# Patient Record
Sex: Female | Born: 1955 | Race: White | Marital: Married | Smoking: Current every day smoker
Health system: Southern US, Community
[De-identification: ages and names within clinical notes are randomized; demographics above are authoritative.]

## PROBLEM LIST (undated history)

## (undated) DIAGNOSIS — H43811 Vitreous degeneration, right eye: Secondary | ICD-10-CM

## (undated) DIAGNOSIS — E559 Vitamin D deficiency, unspecified: Secondary | ICD-10-CM

## (undated) DIAGNOSIS — E039 Hypothyroidism, unspecified: Secondary | ICD-10-CM

## (undated) DIAGNOSIS — G500 Trigeminal neuralgia: Secondary | ICD-10-CM

## (undated) DIAGNOSIS — E538 Deficiency of other specified B group vitamins: Secondary | ICD-10-CM

## (undated) HISTORY — DX: Trigeminal neuralgia: G50.0

## (undated) HISTORY — DX: Hypothyroidism, unspecified: E03.9

## (undated) HISTORY — DX: Deficiency of other specified B group vitamins: E53.8

## (undated) HISTORY — DX: Vitamin D deficiency, unspecified: E55.9

## (undated) HISTORY — PX: TONSILLECTOMY AND ADENOIDECTOMY: SUR1326

## (undated) HISTORY — DX: Vitreous degeneration, right eye: H43.811

---

## 2009-08-01 DIAGNOSIS — R5383 Other fatigue: Secondary | ICD-10-CM | POA: Insufficient documentation

## 2009-08-01 DIAGNOSIS — E039 Hypothyroidism, unspecified: Secondary | ICD-10-CM | POA: Insufficient documentation

## 2009-08-01 HISTORY — DX: Hypothyroidism, unspecified: E03.9

## 2009-08-03 DIAGNOSIS — E538 Deficiency of other specified B group vitamins: Secondary | ICD-10-CM | POA: Insufficient documentation

## 2009-08-03 HISTORY — DX: Deficiency of other specified B group vitamins: E53.8

## 2009-09-26 DIAGNOSIS — E559 Vitamin D deficiency, unspecified: Secondary | ICD-10-CM | POA: Insufficient documentation

## 2009-09-26 DIAGNOSIS — G500 Trigeminal neuralgia: Secondary | ICD-10-CM

## 2009-09-26 HISTORY — DX: Trigeminal neuralgia: G50.0

## 2009-09-26 HISTORY — DX: Vitamin D deficiency, unspecified: E55.9

## 2010-02-24 DIAGNOSIS — IMO0002 Reserved for concepts with insufficient information to code with codable children: Secondary | ICD-10-CM | POA: Insufficient documentation

## 2015-07-06 DIAGNOSIS — R531 Weakness: Secondary | ICD-10-CM | POA: Diagnosis not present

## 2015-07-29 DIAGNOSIS — Z5181 Encounter for therapeutic drug level monitoring: Secondary | ICD-10-CM | POA: Diagnosis not present

## 2015-07-29 DIAGNOSIS — R930 Abnormal findings on diagnostic imaging of skull and head, not elsewhere classified: Secondary | ICD-10-CM | POA: Diagnosis not present

## 2015-07-29 DIAGNOSIS — G501 Atypical facial pain: Secondary | ICD-10-CM | POA: Diagnosis not present

## 2015-08-16 DIAGNOSIS — R270 Ataxia, unspecified: Secondary | ICD-10-CM | POA: Diagnosis not present

## 2015-09-09 DIAGNOSIS — R930 Abnormal findings on diagnostic imaging of skull and head, not elsewhere classified: Secondary | ICD-10-CM | POA: Diagnosis not present

## 2015-09-15 DIAGNOSIS — R930 Abnormal findings on diagnostic imaging of skull and head, not elsewhere classified: Secondary | ICD-10-CM | POA: Diagnosis not present

## 2015-09-28 DIAGNOSIS — R9089 Other abnormal findings on diagnostic imaging of central nervous system: Secondary | ICD-10-CM | POA: Diagnosis not present

## 2015-09-28 DIAGNOSIS — G501 Atypical facial pain: Secondary | ICD-10-CM | POA: Diagnosis not present

## 2015-10-03 DIAGNOSIS — Z720 Tobacco use: Secondary | ICD-10-CM | POA: Diagnosis not present

## 2015-10-03 DIAGNOSIS — R42 Dizziness and giddiness: Secondary | ICD-10-CM | POA: Diagnosis not present

## 2015-11-03 DIAGNOSIS — H539 Unspecified visual disturbance: Secondary | ICD-10-CM | POA: Diagnosis not present

## 2015-11-03 DIAGNOSIS — H0011 Chalazion right upper eyelid: Secondary | ICD-10-CM | POA: Diagnosis not present

## 2015-11-03 DIAGNOSIS — Z720 Tobacco use: Secondary | ICD-10-CM | POA: Diagnosis not present

## 2015-11-04 DIAGNOSIS — H01022 Squamous blepharitis right lower eyelid: Secondary | ICD-10-CM | POA: Diagnosis not present

## 2015-11-04 DIAGNOSIS — H43811 Vitreous degeneration, right eye: Secondary | ICD-10-CM | POA: Diagnosis not present

## 2015-11-04 DIAGNOSIS — H01024 Squamous blepharitis left upper eyelid: Secondary | ICD-10-CM | POA: Diagnosis not present

## 2015-11-04 DIAGNOSIS — H01021 Squamous blepharitis right upper eyelid: Secondary | ICD-10-CM | POA: Diagnosis not present

## 2015-11-30 DIAGNOSIS — G501 Atypical facial pain: Secondary | ICD-10-CM | POA: Diagnosis not present

## 2015-11-30 DIAGNOSIS — H43811 Vitreous degeneration, right eye: Secondary | ICD-10-CM | POA: Diagnosis not present

## 2015-12-09 DIAGNOSIS — H43813 Vitreous degeneration, bilateral: Secondary | ICD-10-CM | POA: Diagnosis not present

## 2015-12-09 DIAGNOSIS — H01003 Unspecified blepharitis right eye, unspecified eyelid: Secondary | ICD-10-CM | POA: Diagnosis not present

## 2015-12-09 DIAGNOSIS — H01006 Unspecified blepharitis left eye, unspecified eyelid: Secondary | ICD-10-CM | POA: Diagnosis not present

## 2016-01-27 DIAGNOSIS — H43813 Vitreous degeneration, bilateral: Secondary | ICD-10-CM | POA: Diagnosis not present

## 2016-01-27 DIAGNOSIS — H3561 Retinal hemorrhage, right eye: Secondary | ICD-10-CM | POA: Diagnosis not present

## 2016-01-27 DIAGNOSIS — H02401 Unspecified ptosis of right eyelid: Secondary | ICD-10-CM | POA: Diagnosis not present

## 2016-02-14 DIAGNOSIS — H3561 Retinal hemorrhage, right eye: Secondary | ICD-10-CM | POA: Diagnosis not present

## 2016-02-14 DIAGNOSIS — H43813 Vitreous degeneration, bilateral: Secondary | ICD-10-CM | POA: Diagnosis not present

## 2016-02-14 DIAGNOSIS — H02401 Unspecified ptosis of right eyelid: Secondary | ICD-10-CM | POA: Diagnosis not present

## 2016-03-05 DIAGNOSIS — G501 Atypical facial pain: Secondary | ICD-10-CM | POA: Diagnosis not present

## 2016-03-05 DIAGNOSIS — H02401 Unspecified ptosis of right eyelid: Secondary | ICD-10-CM | POA: Diagnosis not present

## 2016-03-05 LAB — TSH: TSH: 6.05 — AB (ref 0.41–5.90)

## 2016-05-02 DIAGNOSIS — E0500 Thyrotoxicosis with diffuse goiter without thyrotoxic crisis or storm: Secondary | ICD-10-CM | POA: Diagnosis not present

## 2016-05-21 DIAGNOSIS — E039 Hypothyroidism, unspecified: Secondary | ICD-10-CM | POA: Diagnosis not present

## 2016-05-21 DIAGNOSIS — Z720 Tobacco use: Secondary | ICD-10-CM | POA: Diagnosis not present

## 2016-05-21 DIAGNOSIS — H532 Diplopia: Secondary | ICD-10-CM | POA: Diagnosis not present

## 2016-05-21 DIAGNOSIS — E063 Autoimmune thyroiditis: Secondary | ICD-10-CM | POA: Diagnosis not present

## 2016-05-30 DIAGNOSIS — E039 Hypothyroidism, unspecified: Secondary | ICD-10-CM | POA: Diagnosis not present

## 2016-05-30 DIAGNOSIS — E069 Thyroiditis, unspecified: Secondary | ICD-10-CM | POA: Diagnosis not present

## 2016-05-30 DIAGNOSIS — E041 Nontoxic single thyroid nodule: Secondary | ICD-10-CM | POA: Diagnosis not present

## 2016-07-02 DIAGNOSIS — E039 Hypothyroidism, unspecified: Secondary | ICD-10-CM | POA: Diagnosis not present

## 2016-07-02 LAB — CBC AND DIFFERENTIAL: HEMOGLOBIN: 14.4 g/dL (ref 12.0–16.0)

## 2016-07-02 LAB — TSH: TSH: 9.61 u[IU]/mL — AB (ref 0.41–5.90)

## 2016-07-04 DIAGNOSIS — E039 Hypothyroidism, unspecified: Secondary | ICD-10-CM | POA: Diagnosis not present

## 2016-08-07 ENCOUNTER — Encounter: Payer: Self-pay | Admitting: Family Medicine

## 2016-08-07 ENCOUNTER — Ambulatory Visit (INDEPENDENT_AMBULATORY_CARE_PROVIDER_SITE_OTHER): Payer: BLUE CROSS/BLUE SHIELD | Admitting: Family Medicine

## 2016-08-07 VITALS — BP 117/64 | HR 58 | Ht 64.0 in | Wt 123.0 lb

## 2016-08-07 DIAGNOSIS — E538 Deficiency of other specified B group vitamins: Secondary | ICD-10-CM

## 2016-08-07 DIAGNOSIS — E038 Other specified hypothyroidism: Secondary | ICD-10-CM | POA: Diagnosis not present

## 2016-08-07 DIAGNOSIS — E063 Autoimmune thyroiditis: Secondary | ICD-10-CM

## 2016-08-07 DIAGNOSIS — H43811 Vitreous degeneration, right eye: Secondary | ICD-10-CM | POA: Diagnosis not present

## 2016-08-07 DIAGNOSIS — E559 Vitamin D deficiency, unspecified: Secondary | ICD-10-CM

## 2016-08-07 DIAGNOSIS — R5383 Other fatigue: Secondary | ICD-10-CM | POA: Diagnosis not present

## 2016-08-07 DIAGNOSIS — G500 Trigeminal neuralgia: Secondary | ICD-10-CM

## 2016-08-07 DIAGNOSIS — R2689 Other abnormalities of gait and mobility: Secondary | ICD-10-CM | POA: Insufficient documentation

## 2016-08-07 HISTORY — DX: Vitreous degeneration, right eye: H43.811

## 2016-08-07 NOTE — Progress Notes (Signed)
Briana Henderson is a 61 y.o. female who presents to [facility] today for establish care and discuss various medical problems.  Patient has a history of Hashimoto's thyroiditis. Her endocrinologist has been adjusting medicines recently and she is due for recheck TSH and free T4 in the near future. She is not satisfied with her endocrinologist and wishes to switch care if possible. She feels reasonably well and denies feeling to hold her to hot skin or hair changes.  She does have a serious issue with decreased vision in her right eye. A few months ago she suffered a vitreous detachment. She's been managed by multiple different ophthalmologist. She would like to return to her ophthalmologist Briana John, MD.  she notes that her endocrinologist refused to refer to a non[facility] affiliated ophthalmologist. She would like to go back to see [facility]. She notes decreased vision and double vision. She finds is very obnoxious.  Additionally patient has a history of left-sided trigeminal neuralgia. This is been ongoing for years. She has tried multiple different medications which she has not tolerated well. She's currently using lidocaine topically as well as topical capsaicin. She notes this works reasonably well.  Lastly she notes difficulty with balance. She notes that she is trouble with balance and is currently being worked up by a neurologist. She has not had a trial of physical therapy. Her goals are to be a little walk a few miles with her husband without falling.   Past Medical History:  Diagnosis Date  . B12 deficiency 08/03/2009   Overview:  Vitamin B12 Deficiency  . Hypothyroidism 08/01/2009   Overview:  Hypothyroidism  10/1 IMO update  . Trigeminal neuralgia 09/26/2009   Overview:  Trigeminal Neuralgia  . Vitamin D deficiency 09/26/2009   Overview:  Vitamin D Deficiency   10/1 IMO update  . Vitreous detachment of right eye 08/07/2016   Past Surgical History:  Procedure Laterality Date  . TONSILLECTOMY AND ADENOIDECTOMY     Social History  Substance Use Topics  . Smoking status: Current Every Day Smoker  . Smokeless tobacco: Never Used  . Alcohol use No   family history includes Cancer in her paternal grandfather; Diabetes in her brother, father, mother, and sister; Heart disease in her father, maternal grandfather, maternal grandmother, and mother; Hyperlipidemia in her mother; Hypertension in her father and mother.  ROS as above: No new headache, visual changes, nausea, vomiting, diarrhea, constipation, dizziness, abdominal pain, skin rash, fevers, chills, night sweats, weight loss, swollen lymph nodes, body aches, joint swelling, muscle aches, chest pain, shortness of breath, mood changes, visual or auditory hallucinations.    Medications: Current Outpatient Prescriptions  Medication Sig Dispense Refill  . levothyroxine (SYNTHROID) 75 MCG tablet Take one tablet daily and 1-1/2 tablets on Saturday AND Sunday.    . phenytoin (DILANTIN) 100 MG ER capsule TAKE 2 CAPSULES DAILY    . traMADol (ULTRAM) 50 MG tablet Take by mouth.    . fluticasone (FLONASE) 50 MCG/ACT nasal spray fluticasone 50 mcg/actuation nasal spray,suspension    . levothyroxine (SYNTHROID, LEVOTHROID) 75 MCG tablet levothyroxine 75 mcg tablet     No current facility-administered medications for this visit.    Allergies  Allergen Reactions  . Penicillins Other (See Comments) and Hives  . Topiramate     seizures  . Duloxetine Hcl Diarrhea and Nausea Only  . Gabapentin Diarrhea and Nausea Only  . Levetiracetam Diarrhea and Nausea Only  .  Moxifloxacin Hcl In Nacl     hypothermia  . Pregabalin Diarrhea and Nausea Only  . Thimerosal     Health Maintenance Health Maintenance  Topic Date Due  . Hepatitis C Screening  [birth year]  . HIV Screening  02/05/1970  . TETANUS/TDAP   02/05/1974  . PAP SMEAR  02/06/1976  . MAMMOGRAM  02/05/2005  . COLONOSCOPY  02/05/2005  . INFLUENZA VACCINE  10/17/2016     Exam:  BP 117/64   Pulse (!) 58   Ht 5\' 4"  (1.626 m)   Wt 123 lb (55.8 kg)   BMI 21.11 kg/m   Gen: Well NAD HEENT: EOMI,  MMM No nodule or goiter palpable Lungs: Normal work of breathing. CTABL Heart: RRR no MRG Abd: NABS, Soft. Nondistended, Nontender Exts: Brisk capillary refill, warm and well perfused.  Neuro alert and oriented normal coordination.   Ultrasound thyroid3/14/2018 [facility] Result Impression  IMPRESSION: Heterogeneous hypervascular gland consistent with thyroiditis.  8 mm solid nodule in the lower pole of the left lobe.  Result Narrative  TECHNIQUE: Realtime, multiplanar grayscale and color Doppler ultrasound of the thyroid was performed  COMPARISON: None  INDICATION: hypothyroidism 61 year old female  FINDINGS: . Right lobe (cm): 4.2 x 1.4 x 1.4 . Left lobe (cm): 3.1 x 1.2 x 1.1 . Isthmus (mm): 3 Slightly hypoechoic heterogeneous echotexture is identified bilaterally with increased vascularity in both lobes.  . Right thyroid nodules: None . Left thyroid nodules: Slightly hyperechoic 8 x 5 x 6 mm ovoid solid nodule in the lower pole of the left lobe. . Abnormal lymph nodes: None   CT Angio Cerebral W 3D Reconstruction6/29/2017 [facility] Result Narrative  [id number] Attending EA:[id number] KRAFT [phone] Ordering GN:FAOZHY KRAFT Date of Birth:1957/02/14Sex: F Admit Date:09/15/2015 10:00  ###FINAL RESULT###    INDICATIONS: ABNORMAL FINDING ON DIAGNOSTIC IMAGING OF SKULL AND HEAD,  ABNORMAL RIGHT TRANSVE COMMENTS:   PROCEDURE:QCT 0017- CT ANGIO BRAIN/HEAD - Sep 15 2015  Syngo Accession #: [id number] DaVinci Accession #: [id number]     INDICATION: ABNORMAL FINDING ON DIAGNOSTIC IMAGING OF SKULL AND HEAD,  ABNORMAL RIGHT TRANSVERSE SINUS. I-STAT perform, creatinine equals 0.9  mg/dL,  estimated GFR equals 64.  STUDY: CTA COW performed on 09/15/2015 10:30 AM.  COMPARISON: MRI of the head performed on 05/10/2011.  TECHNIQUE: Scout and centrally focus precontrast axial images were first  obtained, followed by a small test bolus of contrast for CTA timing  purposes. Thereafter, a full dose of iodinated contrast was administered  intravenously by rapid injection, with further multislice axial sections  acquired in the arterial phase from the skull base to the cranial vertex.  Image postprocessing was then performed on the independent workstation,  creating multiplanar and 3-D reformatted images for comprehensive  analysis and diagnosis of the extra and intracranial circulation  including the circle of Willis.  Contrast:100 mL [id number]  FINDINGS: #Internal carotid arteries: No aneurysm, significant stenosis or  occlusion. #Anterior circulation: No aneurysm, significant stenosis or occlusion. #Posterior circulation: No aneurysm, significant stenosis or occlusion. #Anatomic variants: There is a posterior communicating infundibulum on  the RIGHT. A fetal type configuration to the PCA is noted on the LEFT. #Venous sinuses: The RIGHT transverse sinus is hypoplastic with a  narrowing at the transverse-sigmoid junction.  #Additional comments: None.    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  1)Degree of stenosis is determined using NASCET measurement technique: Severe: 70-99% Moderate: 50-69% Mild: Less than 50%    Care Everywhere Result  Report TSH4/16/2018 [facility] Component Name Value Ref Range  TSH 9.610 (H) 0.450 - 4.500 uIU/mL   Specimen  Blood  Result Narrative  Performed at:01 - [facility] [address], [place], [id number] Lab Director: Mila HomerWilliam F Hancock MD, Phone:[phone]     No results found for this or any previous visit (from the past 72  hour(s)). No results found.    Assessment and Plan: 61 y.o. female with   Hypothyroid: Likely Hashimoto's. Plan to recheck TSH and T4. We'll repeat thyroid ultrasound scan in 3-6 months following scan in March listed above.  Return to clinic in 1-2 months. Algorithm would indicate fine needle aspiration if the nodule exceeds 1 cm.  Right eye vitreous detachment: Refer back to ophthalmology. Continue to follow.  Trigeminal neuralgia: Continue to manage with neurology.  Impaired balance: Refer to home health physical therapy. Patient will benefit from balance training and potential assistive device such as a rolling walker.  Recheck in 1-2 months. We'll slowly work through the back list of health maintenance items as tolerated by the patient.     Orders Placed This Encounter  Procedures  . TSH  . T4, free  . Vitamin B12  . VITAMIN D 25 Hydroxy (Vit-D Deficiency, Fractures)  . COMPLETE METABOLIC PANEL WITH GFR  . Lipid Panel w/reflex Direct LDL  . Dilantin (Phenytoin) level, total  . Ambulatory referral to Ophthalmology    Referral Priority:   Routine    Referral Type:   Consultation    Referral Reason:   Specialty Services Required    Requested Specialty:   Ophthalmology    Number of Visits Requested:   1  . Ambulatory referral to Home Health    Referral Priority:   Routine    Referral Type:   Home Health Care    Referral Reason:   Specialty Services Required    Requested Specialty:   Home Health Services    Number of Visits Requested:   1   Meds ordered this encounter  Medications  . DISCONTD: phenytoin (DILANTIN) 50 MG tablet    Sig: phenytoin 50 mg chewable tablet  . levothyroxine (SYNTHROID, LEVOTHROID) 75 MCG tablet    Sig: levothyroxine 75 mcg tablet  . traMADol (ULTRAM) 50 MG tablet    Sig: Take by mouth.  . levothyroxine (SYNTHROID) 75 MCG tablet    Sig: Take one tablet daily and 1-1/2 tablets on Saturday AND Sunday.  . phenytoin (DILANTIN) 100 MG ER  capsule    Sig: TAKE 2 CAPSULES DAILY  . fluticasone (FLONASE) 50 MCG/ACT nasal spray    Sig: fluticasone 50 mcg/actuation nasal spray,suspension     Discussed warning signs or symptoms. Please see discharge instructions. Patient expresses understanding.

## 2016-08-07 NOTE — Patient Instructions (Addendum)
Thank you for coming in today. Get labs in the near future fasting.  Lets recheck in 2 months or so.  We may change things based on a results.  I do recommend you continue to see your neurologist.  If you are unhappy with the Endocrinologist we can find someone else if needed.   You should hear from the Eye doctor soon.  Let me know if you do not hear anything.    You should also hear from the home health people about balance pt.

## 2016-08-10 ENCOUNTER — Encounter: Payer: Self-pay | Admitting: Family Medicine

## 2016-08-10 LAB — THYROID STIMULATING IMMUNOGLOBULIN: TSI: <0.1

## 2016-08-10 LAB — LYME, TOTAL AB TEST/REFLEX

## 2016-08-10 LAB — ACETYLCHOLINE RECEPTOR, BINDING: Acetylcholine Rec Binding: <0.03

## 2016-08-10 LAB — PHENYTOIN LEVEL, FREE AND TOTAL: Phenytoin: 3.7

## 2016-08-16 DIAGNOSIS — E063 Autoimmune thyroiditis: Secondary | ICD-10-CM | POA: Diagnosis not present

## 2016-08-16 DIAGNOSIS — E038 Other specified hypothyroidism: Secondary | ICD-10-CM | POA: Diagnosis not present

## 2016-08-16 DIAGNOSIS — E559 Vitamin D deficiency, unspecified: Secondary | ICD-10-CM | POA: Diagnosis not present

## 2016-08-16 DIAGNOSIS — E538 Deficiency of other specified B group vitamins: Secondary | ICD-10-CM | POA: Diagnosis not present

## 2016-08-16 LAB — TSH: TSH: 8.28 mIU/L — ABNORMAL HIGH

## 2016-08-16 LAB — COMPLETE METABOLIC PANEL WITH GFR
ALT: 9 U/L (ref 6–29)
AST: 12 U/L (ref 10–35)
Albumin: 4.1 g/dL (ref 3.6–5.1)
Alkaline Phosphatase: 82 U/L (ref 33–130)
BUN: 7 mg/dL (ref 7–25)
CO2: 23 mmol/L (ref 20–31)
CREATININE: 0.84 mg/dL (ref 0.50–0.99)
Calcium: 9.1 mg/dL (ref 8.6–10.4)
Chloride: 105 mmol/L (ref 98–110)
GFR, EST NON AFRICAN AMERICAN: 75 mL/min (ref >=60)
GFR, Est African American: 87 mL/min (ref >=60)
GLUCOSE: 84 mg/dL (ref 65–99)
Potassium: 4.2 mmol/L (ref 3.5–5.3)
SODIUM: 140 mmol/L (ref 135–146)
TOTAL PROTEIN: 6.1 g/dL (ref 6.1–8.1)
Total Bilirubin: 0.3 mg/dL (ref 0.2–1.2)

## 2016-08-16 LAB — LIPID PANEL W/REFLEX DIRECT LDL
Cholesterol: 192 mg/dL (ref <200)
HDL: 50 mg/dL — AB (ref >50)
LDL-CHOLESTEROL: 117 mg/dL — AB
Non-HDL Cholesterol (Calc): 142 mg/dL — ABNORMAL HIGH (ref <130)
Total CHOL/HDL Ratio: 3.8 Ratio (ref <5.0)
Triglycerides: 133 mg/dL (ref <150)

## 2016-08-16 LAB — VITAMIN D 25 HYDROXY (VIT D DEFICIENCY, FRACTURES): VIT D 25 HYDROXY: 22 ng/mL — AB (ref 30–100)

## 2016-08-16 LAB — T4, FREE: FREE T4: 1 ng/dL (ref 0.8–1.8)

## 2016-08-17 LAB — PHENYTOIN LEVEL, TOTAL: PHENYTOIN LVL: 3 mg/L — AB (ref 10.0–20.0)

## 2016-08-17 LAB — VITAMIN B12: VITAMIN B 12: 1679 pg/mL — AB (ref 200–1100)

## 2016-08-20 MED ORDER — LEVOTHYROXINE SODIUM 88 MCG PO TABS: 88.0000 ug | ORAL_TABLET | Freq: Every day | ORAL | 1 refills | Status: DC

## 2016-08-20 NOTE — Addendum Note (Signed)
Addended by: Rodolph BongOREY, Finnlee Guarnieri S on: 08/20/2016 07:47 AM   Modules accepted: Orders

## 2016-08-23 DIAGNOSIS — G501 Atypical facial pain: Secondary | ICD-10-CM | POA: Diagnosis not present

## 2016-08-23 DIAGNOSIS — E0500 Thyrotoxicosis with diffuse goiter without thyrotoxic crisis or storm: Secondary | ICD-10-CM | POA: Diagnosis not present

## 2016-08-23 DIAGNOSIS — R531 Weakness: Secondary | ICD-10-CM | POA: Diagnosis not present

## 2016-09-07 DIAGNOSIS — H35033 Hypertensive retinopathy, bilateral: Secondary | ICD-10-CM | POA: Diagnosis not present

## 2016-09-07 DIAGNOSIS — H43813 Vitreous degeneration, bilateral: Secondary | ICD-10-CM | POA: Diagnosis not present

## 2016-09-07 DIAGNOSIS — H3561 Retinal hemorrhage, right eye: Secondary | ICD-10-CM | POA: Diagnosis not present

## 2016-09-07 DIAGNOSIS — H02401 Unspecified ptosis of right eyelid: Secondary | ICD-10-CM | POA: Diagnosis not present

## 2016-09-12 ENCOUNTER — Encounter: Payer: Self-pay | Admitting: Family Medicine

## 2016-09-12 LAB — SEDIMENTATION RATE: SED RATE: 2

## 2016-09-12 LAB — ACETYLCHOLINE RECEPTOR, MODULATING: AChR Binding Ab, Serum: <12

## 2016-10-04 ENCOUNTER — Ambulatory Visit (INDEPENDENT_AMBULATORY_CARE_PROVIDER_SITE_OTHER): Payer: BLUE CROSS/BLUE SHIELD | Admitting: Family Medicine

## 2016-10-04 ENCOUNTER — Encounter: Payer: Self-pay | Admitting: Family Medicine

## 2016-10-04 VITALS — BP 117/69 | HR 73 | Ht 64.0 in | Wt 121.0 lb

## 2016-10-04 DIAGNOSIS — E538 Deficiency of other specified B group vitamins: Secondary | ICD-10-CM

## 2016-10-04 DIAGNOSIS — E038 Other specified hypothyroidism: Secondary | ICD-10-CM

## 2016-10-04 DIAGNOSIS — E063 Autoimmune thyroiditis: Secondary | ICD-10-CM

## 2016-10-04 DIAGNOSIS — M25572 Pain in left ankle and joints of left foot: Secondary | ICD-10-CM

## 2016-10-04 DIAGNOSIS — E559 Vitamin D deficiency, unspecified: Secondary | ICD-10-CM

## 2016-10-04 DIAGNOSIS — G8929 Other chronic pain: Secondary | ICD-10-CM | POA: Insufficient documentation

## 2016-10-04 DIAGNOSIS — R5383 Other fatigue: Secondary | ICD-10-CM | POA: Diagnosis not present

## 2016-10-04 LAB — T3, FREE: T3, Free: 2.6 pg/mL (ref 2.3–4.2)

## 2016-10-04 LAB — T4, FREE: Free T4: 1.2 ng/dL (ref 0.8–1.8)

## 2016-10-04 LAB — TSH: TSH: 7.14 m[IU]/L — AB

## 2016-10-04 MED ORDER — TRIAMCINOLONE 0.1 % CREAM:EUCERIN CREAM 1:1: 1.0000 "application " | TOPICAL_CREAM | Freq: Two times a day (BID) | CUTANEOUS | 12 refills | Status: DC | PRN

## 2016-10-04 NOTE — Progress Notes (Signed)
Briana Henderson is a 61 y.o. female who [name] today for follow-up thyroid disease and discuss left ankle pain and dry skin.  Thyroid disease: Patient has a history of hypothyroidism and currently takes 88 g per day of levothyroxine. This was increased from 75 recently. Overall she's feeling a lot better. She does note new dry skin on her chest however. She will need a new prescription soon.  Dry skin: Patient has a patch of dry skin on her neck. This is been present for about a month. She has not tried any treatment yet and she feels well otherwise no fevers or chills vomiting or diarrhea.  Left ankle pain: Patient has a history of mild left ankle pain. She notes this is quite well controlled with compression socks and good quality sneakers. She denies any locking catching or giving way.   Past Medical History:  Diagnosis Date  . B12 deficiency 08/03/2009   Overview:  Vitamin B12 Deficiency  . Hypothyroidism 08/01/2009   Overview:  Hypothyroidism  10/1 IMO update  . Trigeminal neuralgia 09/26/2009   Overview:  Trigeminal Neuralgia  . Vitamin D deficiency 09/26/2009   Overview:  Vitamin D Deficiency  10/1 IMO update  . Vitreous detachment of right eye 08/07/2016   Past Surgical History:  Procedure Laterality Date  . TONSILLECTOMY AND ADENOIDECTOMY     Social History  Substance Use Topics  . Smoking status: Current Every Day Smoker  . Smokeless tobacco: Never Used  . Alcohol use No   family history includes Cancer in her paternal grandfather; Diabetes in her brother, father, mother, and sister; Heart disease in her father, maternal grandfather, maternal grandmother, and mother; Hyperlipidemia in her mother; Hypertension in her father and mother.  ROS as above:  Medications: Current Outpatient Prescriptions  Medication Sig Dispense Refill  .  fluticasone (FLONASE) 50 MCG/ACT nasal spray fluticasone 50 mcg/actuation nasal spray,suspension    . levothyroxine (SYNTHROID) 88 MCG tablet Take 1 tablet (88 mcg total) by mouth daily before breakfast. 30 tablet 1  . phenytoin (DILANTIN) 100 MG ER capsule TAKE 2 CAPSULES DAILY    . traMADol (ULTRAM) 50 MG tablet Take by mouth.    . Triamcinolone Acetonide (TRIAMCINOLONE 0.1 % CREAM : EUCERIN) CREA Apply 1 application topically 2 (two) times daily as needed. Disp 1 pound 1 each 12   No current facility-administered medications for this visit.    Allergies  Allergen Reactions  . Penicillins Other (See Comments) and Hives  . Topiramate     seizures  . Duloxetine Hcl Diarrhea and Nausea Only  . Gabapentin Diarrhea and Nausea Only  . Levetiracetam Diarrhea and Nausea Only  . Moxifloxacin Hcl In Nacl     hypothermia  . Pregabalin Diarrhea and Nausea Only  . Thimerosal     Health Maintenance Health Maintenance  Topic Date Due  . Hepatitis C Screening  11-11-1955  . HIV Screening  02/05/1970  . TETANUS/TDAP  02/05/1974  . PAP SMEAR  02/06/1976  . MAMMOGRAM  02/05/2005  . COLONOSCOPY  02/05/2005  . INFLUENZA VACCINE  10/17/2016     Exam:  BP 117/69   Pulse 73   Ht 5\' 4"  (1.626 m)   Wt 121 lb (54.9 kg)   BMI 20.77 kg/m  Gen: Well NAD HEENT: EOMI,  MMM No goiter Lungs: Normal work of breathing. CTABL Heart: RRR no MRG Abd: NABS, Soft. Nondistended, Nontender Exts: Brisk capillary refill,  warm and well perfused.  Skin: No erythema. Slightly dry skin on neck. MSK: Left ankle normal-appearing no swelling. Mildly tender to palpation at the ATFL area. Stable ligamentous exam normal strength.   No results found for this or any previous visit (from the past 72 hour(s)). No results found.    Assessment and Plan: 61 y.o. female with  Hypothyroid: Plan to recheck thyroid labs today and adjust levothyroxine dose accordingly.  Dry skin: Likely simple dry skin. Thyroid may be a  factor. Plan for Eucerin with triamcinolone cream.  Ankle pain: Likely mild degenerative. Patient declines x-rays at this time. Plan for compressive ankle sleeve.  Vitamin D deficiency and B-12 deficiency. We'll recheck these labs.   Orders Placed This Encounter  Procedures  . T4, free  . T3, free  . TSH  . VITAMIN D 25 Hydroxy (Vit-D Deficiency, Fractures)  . Vitamin B12   Meds ordered this encounter  Medications  . Triamcinolone Acetonide (TRIAMCINOLONE 0.1 % CREAM : EUCERIN) CREA    Sig: Apply 1 application topically 2 (two) times daily as needed. Disp 1 pound    Dispense:  1 each    Refill:  12     Discussed warning signs or symptoms. Please see discharge instructions. Patient expresses understanding.

## 2016-10-04 NOTE — Patient Instructions (Addendum)
Thank you for coming in today. Get labs today.  We will adjust thryoid dose based on labs.  Apply the cream to the dry skin.  We will see how it is doing.

## 2016-10-05 LAB — VITAMIN B12: Vitamin B-12: 968 pg/mL (ref 200–1100)

## 2016-10-05 LAB — VITAMIN D 25 HYDROXY (VIT D DEFICIENCY, FRACTURES): VIT D 25 HYDROXY: 25 ng/mL — AB (ref 30–100)

## 2016-10-05 MED ORDER — LEVOTHYROXINE SODIUM 100 MCG PO TABS: 100.0000 ug | ORAL_TABLET | Freq: Every day | ORAL | 0 refills | Status: DC

## 2016-10-05 NOTE — Addendum Note (Signed)
Addended by: Rodolph BongOREY, Marcell Pfeifer S on: 10/05/2016 05:42 AM   Modules accepted: Orders

## 2016-10-10 ENCOUNTER — Other Ambulatory Visit: Payer: Self-pay | Admitting: *Deleted

## 2016-10-10 MED ORDER — TRIAMCINOLONE 0.1 % CREAM:EUCERIN CREAM 1:1: 1.0000 "application " | TOPICAL_CREAM | Freq: Two times a day (BID) | CUTANEOUS | 12 refills | Status: DC | PRN

## 2016-10-10 NOTE — Progress Notes (Signed)
Pt called and lvm stating that the cream will need to be sent to her local pharmacy. Will reprint and fax.Loralee PacasBarkley, Adewale Pucillo BlomkestLynetta

## 2016-11-23 ENCOUNTER — Ambulatory Visit (INDEPENDENT_AMBULATORY_CARE_PROVIDER_SITE_OTHER): Payer: BLUE CROSS/BLUE SHIELD | Admitting: Family Medicine

## 2016-11-23 ENCOUNTER — Encounter: Payer: Self-pay | Admitting: Family Medicine

## 2016-11-23 VITALS — BP 113/60 | HR 72 | Wt 122.0 lb

## 2016-11-23 DIAGNOSIS — E038 Other specified hypothyroidism: Secondary | ICD-10-CM

## 2016-11-23 DIAGNOSIS — E063 Autoimmune thyroiditis: Secondary | ICD-10-CM

## 2016-11-23 DIAGNOSIS — E559 Vitamin D deficiency, unspecified: Secondary | ICD-10-CM | POA: Diagnosis not present

## 2016-11-23 DIAGNOSIS — R002 Palpitations: Secondary | ICD-10-CM

## 2016-11-23 DIAGNOSIS — E041 Nontoxic single thyroid nodule: Secondary | ICD-10-CM | POA: Diagnosis not present

## 2016-11-23 DIAGNOSIS — G500 Trigeminal neuralgia: Secondary | ICD-10-CM

## 2016-11-23 DIAGNOSIS — E538 Deficiency of other specified B group vitamins: Secondary | ICD-10-CM

## 2016-11-23 MED ORDER — PHENYTOIN SODIUM EXTENDED 100 MG PO CAPS: 300.0000 mg | ORAL_CAPSULE | Freq: Every day | ORAL | 1 refills | Status: DC

## 2016-11-23 MED ORDER — TRIAMCINOLONE ACETONIDE 0.1 % EX CREA: 1.0000 "application " | TOPICAL_CREAM | Freq: Two times a day (BID) | CUTANEOUS | 12 refills | Status: AC

## 2016-11-23 MED ORDER — FLUTICASONE PROPIONATE 50 MCG/ACT NA SUSP: 1.0000 | Freq: Every day | NASAL | 3 refills | Status: DC

## 2016-11-23 NOTE — Patient Instructions (Addendum)
Thank you for coming in today. Get labs and ultrasound.  You should hear about the holter monitor soon.  Recheck in 1 month.  Return sooner if needed.    Palpitations A palpitation is the feeling that your heartbeat is irregular or is faster than normal. It may feel like your heart is fluttering or skipping a beat. Palpitations are usually not a serious problem. They may be caused by many things, including smoking, caffeine, alcohol, stress, and certain medicines. Although most causes of palpitations are not serious, palpitations can be a sign of a serious medical problem. In some cases, you may need further medical evaluation. Follow these instructions at home: Pay attention to any changes in your symptoms. Take these actions to help with your condition:  Avoid the following: ? Caffeinated coffee, tea, soft drinks, diet pills, and energy drinks. ? Chocolate. ? Alcohol.  Do not use any tobacco products, such as cigarettes, chewing tobacco, and e-cigarettes. If you need help quitting, ask your health care provider.  Try to reduce your stress and anxiety. Things that can help you relax include: ? Yoga. ? Meditation. ? Physical activity, such as swimming, jogging, or walking. ? Biofeedback. This is a method that helps you learn to use your mind to control things in your body, such as your heartbeats.  Get plenty of rest and sleep.  Take over-the-counter and prescription medicines only as told by your health care provider.  Keep all follow-up visits as told by your health care provider. This is important.  Contact a health care provider if:  You continue to have a fast or irregular heartbeat after 24 hours.  Your palpitations occur more often. Get help right away if:  You have chest pain or shortness of breath.  You have a severe headache.  You feel dizzy or you faint. This information is not intended to replace advice given to you by your health care provider. Make sure you  discuss any questions you have with your health care provider. Document Released: 03/02/2000 Document Revised: 08/08/2015 Document Reviewed: 11/18/2014 Elsevier Interactive Patient Education  2017 [employer]

## 2016-11-23 NOTE — Progress Notes (Signed)
Briana Henderson is a 61 y.o. female who [facility]: [facility] today for her thyroid, discuss palpitations, B-12 and vitamin D deficiency, and follow-up rash.  Thyroid: Patient previously was diagnosed with Hashimoto's thyroiditis and subsequent hypothyroidism. This is Located with small thyroid nodule [facility]. She's had her levothyroxine titrated and has done pretty well. She currently takes 100 g daily. She denies feeling too hot or too cold.  Palpitatons: Patient notes new onset palpitations over the last week. She feels tachypalpitations and device any chest pain shortness of breath lightheadedness or dizziness.  Rash: Patient was found to have some dry skin a few months ago. She's been using some lotion but not gotten much better. She was never able to pick up the triamcinolone that was prescribed.  Trigeminal neuralgia: Patient is a history of trigeminal neuralgia that typically is well-controlled with 200 mg of Dilantin. She notes her symptoms have worsened and she like to increase the dose to 300 mg if possible.   B-12 and vitamin D deficiency: These were recently adjusted and patient would like to have the levels rechecked soon.  Past Medical History:  Diagnosis Date  . B12 deficiency 08/03/2009   Overview:  Vitamin B12 Deficiency  . Hypothyroidism 08/01/2009   Overview:  Hypothyroidism  10/1 IMO update  . Trigeminal neuralgia 09/26/2009   Overview:  Trigeminal Neuralgia  . Vitamin D deficiency 09/26/2009   Overview:  Vitamin D Deficiency  10/1 IMO update  . Vitreous detachment of right eye 08/07/2016   Past Surgical History:  Procedure Laterality Date  . TONSILLECTOMY AND ADENOIDECTOMY     Social History  Substance Use Topics  . Smoking status: Current Every Day Smoker  . Smokeless tobacco: Never Used  . Alcohol use  No   family history includes Cancer in her paternal grandfather; Diabetes in her brother, father, mother, and sister; Heart disease in her father, maternal grandfather, maternal grandmother, and mother; Hyperlipidemia in her mother; Hypertension in her father and mother.  ROS as above:  Medications: Current Outpatient Prescriptions  Medication Sig Dispense Refill  . fluticasone (FLONASE) 50 MCG/ACT nasal spray Place 1 spray into both nostrils daily. 48 g 3  . levothyroxine (SYNTHROID, LEVOTHROID) 100 MCG tablet Take 1 tablet (100 mcg total) by mouth daily. 90 tablet 0  . phenytoin (DILANTIN) 100 MG ER capsule Take 3 capsules (300 mg total) by mouth daily. 270 capsule 1  . traMADol (ULTRAM) 50 MG tablet Take by mouth.    . triamcinolone cream (KENALOG) 0.1 % Apply 1 application topically 2 (two) times daily. 453.6 g 12   No current facility-administered medications for this visit.    Allergies  Allergen Reactions  . Penicillins Other (See Comments) and Hives  . Topiramate     seizures  . Duloxetine Hcl Diarrhea and Nausea Only  . Gabapentin Diarrhea and Nausea Only  . Levetiracetam Diarrhea and Nausea Only  . Moxifloxacin Hcl In Nacl     hypothermia  . Pregabalin Diarrhea and Nausea Only  . Thimerosal     Health Maintenance Health Maintenance  Topic Date Due  . Hepatitis C Screening  11/25/1955  . HIV Screening  02/05/1970  . TETANUS/TDAP  02/05/1974  . PAP SMEAR  02/06/1976  . MAMMOGRAM  02/05/2005  . COLONOSCOPY  02/05/2005  . INFLUENZA VACCINE  11/23/2017 (Originally 10/17/2016)     Exam:  BP 113/60  Pulse 72   Wt 122 lb (55.3 kg)   BMI 20.94 kg/m  Gen: Well NAD HEENT: EOMI,  MMM Lungs: Normal work of breathing. CTABL Heart: RRR no MRG Abd: NABS, Soft. Nondistended, Nontender Exts: Brisk capillary refill, warm and well perfused.  Skin: Dry skin chest wall.  12-lead EKG shows normal sinus rhythm at 68 bpm. No ST segment elevation or depression. Normal  EKG.   Lab Results  Component Value Date   TSH 7.14 (H) 10/04/2016    No results found for this or any previous visit (from the past 72 hour(s)). No results found.    Assessment and Plan: 61 y.o. female with  Hypothyroidism with thyroid nodule: Plan to recheck TSH, Free T4 and free T3 as we have adjusted dose. Additionally we'll obtain an ultrasound of the thyroid to follow-up the small nodule seen at [facility] Recheck in a month.  Palpitations: EKG normal today. We'll check metabolic panel and arrange for Holter monitor. Recheck in one month.  Trigeminal neuralgia: Plan to increase Dilantin and recheck Dilantin level next month.  Rash: Doing well use triamcinolone cream.  Vitamin deficiency: Recheck vitamin D and B-12.  Recheck one month.  Orders Placed This Encounter  Procedures  . US Soft Tissue Head/Neck    Standing Status:   Future    Standing Expiration Date:   01/23/2018    Order Specific Question:   Reason for Exam (SYMPTOM  OR DIAGNOSIS REQUIRED)    Answer:   follow up nodule seen in March in [facility]    Order Specific Question:   Preferred imaging location?    Answer:   [facility]  . CBC  . COMPLETE METABOLIC PANEL WITH GFR  . T4, free  . T3, free  . TSH  . VITAMIN D 25 Hydroxy (Vit-D Deficiency, Fractures)  . Vitamin B12   Meds ordered this encounter  Medications  . triamcinolone cream (KENALOG) 0.1 %    Sig: Apply 1 application topically 2 (two) times daily.    Dispense:  453.6 g    Refill:  12  . phenytoin (DILANTIN) 100 MG ER capsule    Sig: Take 3 capsules (300 mg total) by mouth daily.    Dispense:  270 capsule    Refill:  1  . fluticasone (FLONASE) 50 MCG/ACT nasal spray    Sig: Place 1 spray into both nostrils daily.    Dispense:  48 g    Refill:  3     Discussed warning signs or symptoms. Please see discharge instructions. Patient expresses understanding.  I spent 40 minutes with this patient, greater than 50% was face-to-face  time counseling regarding palpitations thyroid nodule Dilantin side effects of medication treatment options and plan.Marland Kitchen

## 2016-11-24 LAB — CBC
HCT: 40.2 % (ref 35.0–45.0)
Hemoglobin: 13.6 g/dL (ref 11.7–15.5)
MCH: 30.1 pg (ref 27.0–33.0)
MCHC: 33.8 g/dL (ref 32.0–36.0)
MCV: 88.9 fL (ref 80.0–100.0)
MPV: 9.2 fL (ref 7.5–12.5)
PLATELETS: 344 10*3/uL (ref 140–400)
RBC: 4.52 10*6/uL (ref 3.80–5.10)
RDW: 12.8 % (ref 11.0–15.0)
WBC: 7.2 10*3/uL (ref 3.8–10.8)

## 2016-11-24 LAB — COMPLETE METABOLIC PANEL WITH GFR
AG Ratio: 2 (calc) (ref 1.0–2.5)
ALKALINE PHOSPHATASE (APISO): 81 U/L (ref 33–130)
ALT: 9 U/L (ref 6–29)
AST: 13 U/L (ref 10–35)
Albumin: 4.5 g/dL (ref 3.6–5.1)
BUN: 8 mg/dL (ref 7–25)
CALCIUM: 9.5 mg/dL (ref 8.6–10.4)
CO2: 28 mmol/L (ref 20–32)
CREATININE: 0.8 mg/dL (ref 0.50–0.99)
Chloride: 107 mmol/L (ref 98–110)
GFR, EST NON AFRICAN AMERICAN: 80 mL/min/{1.73_m2} (ref >=60)
GFR, Est African American: 92 mL/min/{1.73_m2} (ref >=60)
GLOBULIN: 2.2 g/dL (ref 1.9–3.7)
GLUCOSE: 79 mg/dL (ref 65–99)
POTASSIUM: 4.4 mmol/L (ref 3.5–5.3)
SODIUM: 140 mmol/L (ref 135–146)
Total Bilirubin: 0.3 mg/dL (ref 0.2–1.2)
Total Protein: 6.7 g/dL (ref 6.1–8.1)

## 2016-11-24 LAB — TSH: TSH: 4.87 m[IU]/L — AB (ref 0.40–4.50)

## 2016-11-24 LAB — VITAMIN B12: VITAMIN B 12: 952 pg/mL (ref 200–1100)

## 2016-11-24 LAB — VITAMIN D 25 HYDROXY (VIT D DEFICIENCY, FRACTURES): VIT D 25 HYDROXY: 34 ng/mL (ref 30–100)

## 2016-11-24 LAB — T3, FREE: T3 FREE: 2.7 pg/mL (ref 2.3–4.2)

## 2016-11-24 LAB — T4, FREE: FREE T4: 1.1 ng/dL (ref 0.8–1.8)

## 2016-11-27 MED ORDER — LEVOTHYROXINE SODIUM 112 MCG PO TABS: 112.0000 ug | ORAL_TABLET | Freq: Every day | ORAL | 1 refills | Status: DC

## 2016-11-27 NOTE — Addendum Note (Signed)
Addended by: Rodolph BongOREY,  S on: 11/27/2016 07:40 AM   Modules accepted: Orders

## 2016-11-28 NOTE — Addendum Note (Signed)
Addended by: Chalmers CaterUTTLE, Noami Bove H on: 11/28/2016 01:13 PM   Modules accepted: Orders

## 2016-11-29 ENCOUNTER — Ambulatory Visit (INDEPENDENT_AMBULATORY_CARE_PROVIDER_SITE_OTHER): Payer: BLUE CROSS/BLUE SHIELD

## 2016-11-29 DIAGNOSIS — E063 Autoimmune thyroiditis: Secondary | ICD-10-CM

## 2016-11-29 DIAGNOSIS — E041 Nontoxic single thyroid nodule: Secondary | ICD-10-CM | POA: Diagnosis not present

## 2016-11-29 DIAGNOSIS — E039 Hypothyroidism, unspecified: Secondary | ICD-10-CM | POA: Diagnosis not present

## 2016-11-29 DIAGNOSIS — E038 Other specified hypothyroidism: Secondary | ICD-10-CM

## 2016-12-03 ENCOUNTER — Telehealth: Payer: Self-pay

## 2016-12-03 DIAGNOSIS — R002 Palpitations: Secondary | ICD-10-CM

## 2016-12-03 NOTE — Telephone Encounter (Signed)
Per Marylene Land, a referral will need to be placed to cardiology.

## 2016-12-03 NOTE — Telephone Encounter (Signed)
Pt called stating that she does not have transportation to and from [employer] for the holter monitor. She request that the order be sent to an office in [place], [place] suggesting [employer] on [address]. Please advise.

## 2016-12-03 NOTE — Telephone Encounter (Signed)
Sounds good to me. Can we just fax the order there?

## 2016-12-04 NOTE — Telephone Encounter (Signed)
Referral sent 

## 2016-12-04 NOTE — Telephone Encounter (Signed)
Information discussed with pt. Pt verbalized understanding. 

## 2016-12-28 ENCOUNTER — Encounter: Payer: Self-pay | Admitting: Family Medicine

## 2016-12-28 ENCOUNTER — Ambulatory Visit (INDEPENDENT_AMBULATORY_CARE_PROVIDER_SITE_OTHER): Payer: BLUE CROSS/BLUE SHIELD | Admitting: Family Medicine

## 2016-12-28 VITALS — BP 113/73 | HR 70 | Ht 64.0 in | Wt 123.0 lb

## 2016-12-28 DIAGNOSIS — E038 Other specified hypothyroidism: Secondary | ICD-10-CM

## 2016-12-28 DIAGNOSIS — E063 Autoimmune thyroiditis: Secondary | ICD-10-CM

## 2016-12-28 DIAGNOSIS — G500 Trigeminal neuralgia: Secondary | ICD-10-CM

## 2016-12-28 DIAGNOSIS — E041 Nontoxic single thyroid nodule: Secondary | ICD-10-CM

## 2016-12-28 DIAGNOSIS — R21 Rash and other nonspecific skin eruption: Secondary | ICD-10-CM

## 2016-12-28 NOTE — Progress Notes (Signed)
Briana Henderson is a 61 y.o. female who presents to [facility] Briana Henderson: Primary Care Sports Medicine today for follow-up thyroid trigeminal neuralgia and rash.   Briana Henderson has has thyroiditis seen on [place] and noted with elevated TSH. She has had levothyroxine titrate over the past several months. She is tolerating the medication quite well and feels well. Additionally she's had a thyroid nodule that was evaluated with ultrasound recently. She's feeling pretty well from that as well.  Trigeminal neuralgia. Patient has ongoing bothersome trigeminal neuralgia. The last visit we increased her Dilantin. She notes that she is decrease the Dilantin back down to her previous level. She's notes that trigeminal neuralgia seems to be reasonably well-controlled currently.  Rash: Briana Henderson notes mild itchy rash on her bilateral upper arms. She's been scratching a bit and notes some red spots on her arms. Her father had ITP and she is worried about petechiae. She feels well otherwise.   Past Medical History:  Diagnosis Date  . B12 deficiency 08/03/2009   Overview:  Vitamin B12 Deficiency  . Hypothyroidism 08/01/2009   Overview:  Hypothyroidism  10/1 IMO update  . Trigeminal neuralgia 09/26/2009   Overview:  Trigeminal Neuralgia  . Vitamin D deficiency 09/26/2009   Overview:  Vitamin D Deficiency  10/1 IMO update  . Vitreous detachment of right eye 08/07/2016   Past Surgical History:  Procedure Laterality Date  . TONSILLECTOMY AND ADENOIDECTOMY     Social History  Substance Use Topics  . Smoking status: Current Every Day Smoker  . Smokeless tobacco: Never Used  . Alcohol use No   family history includes Cancer in her paternal grandfather; Diabetes in her brother, father, mother, and sister; Heart disease in her father, maternal grandfather, maternal grandmother, and mother; Hyperlipidemia in her mother; Hypertension in  her father and mother.  ROS as above:  Medications: Current Outpatient Prescriptions  Medication Sig Dispense Refill  . fluticasone (FLONASE) 50 MCG/ACT nasal spray Place 1 spray into both nostrils daily. 48 g 3  . levothyroxine (SYNTHROID, LEVOTHROID) 112 MCG tablet Take 1 tablet (112 mcg total) by mouth daily. 90 tablet 1  . phenytoin (DILANTIN) 100 MG ER capsule Take 3 capsules (300 mg total) by mouth daily. 270 capsule 1  . triamcinolone cream (KENALOG) 0.1 % Apply 1 application topically 2 (two) times daily. 453.6 g 12   No current facility-administered medications for this visit.    Allergies  Allergen Reactions  . Penicillins Other (See Comments) and Hives  . Topiramate     seizures  . Duloxetine Hcl Diarrhea and Nausea Only  . Gabapentin Diarrhea and Nausea Only  . Levetiracetam Diarrhea and Nausea Only  . Moxifloxacin Hcl In Nacl     hypothermia  . Pregabalin Diarrhea and Nausea Only  . Thimerosal     Health Maintenance Health Maintenance  Topic Date Due  . Hepatitis C Screening  1955/04/08  . HIV Screening  02/05/1970  . TETANUS/TDAP  02/05/1974  . PAP SMEAR  02/06/1976  . MAMMOGRAM  02/05/2005  . COLONOSCOPY  02/05/2005  . INFLUENZA VACCINE  11/23/2017 (Originally 10/17/2016)     Exam:  BP 113/73   Pulse 70   Ht  (1.626 m)   Wt 123 lb (55.8 kg)   BMI 21.11 kg/m  Gen: Well NAD HEENT: EOMI,  MMM No goiter palpable nodule Lungs: Normal work of breathing. CTABL Heart: RRR no MRG Abd: NABS, Soft. Nondistended, Nontender Exts: Brisk capillary refill, warm and  well perfused.  Skin: Slight excoriated skin upper arms. Scattered occasional petechia. No other petechiae are noted on lower extremities or palate.  Study Result   CLINICAL DATA:  Hypothyroidism, Hashimoto's thyroiditis  EXAM: THYROID ULTRASOUND  TECHNIQUE: Ultrasound examination of the thyroid gland and adjacent soft tissues was performed.  COMPARISON:  Report  only  FINDINGS: Parenchymal Echotexture: Markedly heterogenous  Isthmus: 3 mm  Right lobe: 4.6 x 1.5 x 1.3 cm, previously 4.2 x 1.4 x 1.4 cm  Left lobe: 3.8 x 1.0 x 1.1 cm, previously 3.1 x 1.2 x 1.1 cm  _________________________________________________________  Estimated total number of nodules >/= 1 cm: 0  Number of spongiform nodules >/=  2 cm not described below (TR1): 0  Number of mixed cystic and solid nodules >/= 1.5 cm not described below (TR2): 0  _________________________________________________________  Nodule # 1:  Location: Left; Inferior  Maximum size: 0.7, previously 0.8 cm; Other 2 dimensions: 0.5 x 0.6, unchanged cm  Composition: solid/almost completely solid (2)  Echogenicity: hyperechoic (1)  Shape: not taller-than-wide (0)  Margins: smooth (0)  Echogenic foci: none (0)  ACR TI-RADS total points: 3.  ACR TI-RADS risk category: TR3 (3 points).  ACR TI-RADS recommendations:  Given size (<1.4 cm) and appearance, this nodule does NOT meet TI-RADS criteria for biopsy or dedicated follow-up.  _________________________________________________________  Thyroid gland is markedly heterogeneous with mixed echogenicity and septation. Mild hypervascularity no adenopathy.  IMPRESSION: 0.7 cm left inferior TR 3 nodule does not meet criteria for biopsy or any additional follow-up.  Markedly heterogeneous thyroid gland with hypervascularity compatible with chronic thyroiditis.  The above is in keeping with the ACR TI-RADS recommendations - J Am Coll Radiol 2017;14:587-595.   Electronically Signed   By: Judie Petit.  Shick M.D.   On: 11/29/2016 11:52     No results found for this or any previous visit (from the past 72 hour(s)). No results found.    Assessment and Plan: 61 y.o. female with  Thyroid: Seems to be doing pretty well. Plan to recheck TSH and titrate levothyroxine dose. Recheck in 6 months if all is  well.  Thyroid nodule does not need to be followed any longer per guidelines. We'll continue to follow along.  Trigeminal neuralgia is at baseline continue current dose of Dilantin. Recheck in 6 months.  Rash: Patient likely has dry skin. I think the sixth occasional petechia never seen here due to underlying skin trauma because of scratching and not any significant abnormalities. The platelet count was normal a month ago. Regardless we'll recheck platelets and continue workup if petechia persists or worsens.   Orders Placed This Encounter  Procedures  . CBC  . TSH   No orders of the defined types were placed in this encounter.    Discussed warning signs or symptoms. Please see discharge instructions. Patient expresses understanding.

## 2016-12-28 NOTE — Patient Instructions (Signed)
Thank you for coming in today. Get labs this morning.  Continue triamcinolone cream.  Follow up with Cardiology.  Recheck with me in 6 months.  Return sooner if needed.

## 2016-12-29 LAB — CBC
HCT: 39.2 % (ref 35.0–45.0)
HEMOGLOBIN: 13.5 g/dL (ref 11.7–15.5)
MCH: 30.5 pg (ref 27.0–33.0)
MCHC: 34.4 g/dL (ref 32.0–36.0)
MCV: 88.5 fL (ref 80.0–100.0)
MPV: 9.2 fL (ref 7.5–12.5)
Platelets: 320 10*3/uL (ref 140–400)
RBC: 4.43 10*6/uL (ref 3.80–5.10)
RDW: 12.8 % (ref 11.0–15.0)
WBC: 7.2 10*3/uL (ref 3.8–10.8)

## 2016-12-29 LAB — TSH: TSH: 7.12 m[IU]/L — AB (ref 0.40–4.50)

## 2016-12-31 DIAGNOSIS — I471 Supraventricular tachycardia: Secondary | ICD-10-CM | POA: Diagnosis not present

## 2016-12-31 MED ORDER — LEVOTHYROXINE SODIUM 125 MCG PO TABS: 125.0000 ug | ORAL_TABLET | Freq: Every day | ORAL | 0 refills | Status: DC

## 2016-12-31 NOTE — Addendum Note (Signed)
Addended by: Rodolph Bong on: 12/31/2016 07:11 AM   Modules accepted: Orders

## 2017-01-02 DIAGNOSIS — I471 Supraventricular tachycardia: Secondary | ICD-10-CM | POA: Diagnosis not present

## 2017-01-03 DIAGNOSIS — I471 Supraventricular tachycardia: Secondary | ICD-10-CM | POA: Diagnosis not present

## 2017-01-04 ENCOUNTER — Ambulatory Visit: Payer: BLUE CROSS/BLUE SHIELD | Admitting: Family Medicine

## 2017-03-13 ENCOUNTER — Other Ambulatory Visit: Payer: Self-pay | Admitting: Family Medicine

## 2017-03-20 ENCOUNTER — Telehealth: Payer: Self-pay | Admitting: Family Medicine

## 2017-03-20 DIAGNOSIS — E039 Hypothyroidism, unspecified: Secondary | ICD-10-CM

## 2017-03-20 NOTE — Telephone Encounter (Signed)
Pt called and stated she is suppose to have her thyroid rechecked this month and would like to come in and do it this afternoon if possible but no orders have been put in. Can this be done? Thanks

## 2017-03-20 NOTE — Telephone Encounter (Signed)
Order has been placed. Patient just needs to lab and get them done. Rhonda Cunningham,CMA

## 2017-03-21 ENCOUNTER — Other Ambulatory Visit: Payer: Self-pay | Admitting: Family Medicine

## 2017-03-21 LAB — TSH: TSH: 2.35 mIU/L (ref 0.40–4.50)

## 2017-03-21 MED ORDER — LEVOTHYROXINE SODIUM 125 MCG PO TABS: 125.0000 ug | ORAL_TABLET | Freq: Every day | ORAL | 1 refills | Status: DC

## 2017-04-29 ENCOUNTER — Encounter: Payer: Self-pay | Admitting: Family Medicine

## 2017-04-29 ENCOUNTER — Ambulatory Visit: Payer: BLUE CROSS/BLUE SHIELD | Admitting: Family Medicine

## 2017-04-29 ENCOUNTER — Ambulatory Visit (INDEPENDENT_AMBULATORY_CARE_PROVIDER_SITE_OTHER): Payer: BLUE CROSS/BLUE SHIELD

## 2017-04-29 VITALS — BP 123/79 | HR 71 | Ht 65.0 in | Wt 130.0 lb

## 2017-04-29 DIAGNOSIS — G500 Trigeminal neuralgia: Secondary | ICD-10-CM

## 2017-04-29 DIAGNOSIS — E039 Hypothyroidism, unspecified: Secondary | ICD-10-CM

## 2017-04-29 DIAGNOSIS — M25512 Pain in left shoulder: Secondary | ICD-10-CM

## 2017-04-29 DIAGNOSIS — S4992XA Unspecified injury of left shoulder and upper arm, initial encounter: Secondary | ICD-10-CM | POA: Diagnosis not present

## 2017-04-29 NOTE — Progress Notes (Signed)
Briana Henderson is a 62 y.o. female who presents to [facility] today for left shoulder pain.  [name] fell 2 months ago in December landing on her left side.  She had immediate pain in her shoulder.  Since then she has noted continued pain which is been very bothersome.  The pain is present when she attempts to abduct her arm.  The pain is limiting her ability to take care of things at home and complete tasks at work.  She denies significant pain with when her arm is at rest although the pain is bothersome at night.  Additionally she notes significant pain when she reaches back.  For example she cannot put her bra on by herself.  She is tried over-the-counter medicines for pain which helps some.  Additionally she like to follow-up her trigeminal neuralgia and thyroid disease.  He has a long history of difficult to control trigeminal neuralgia pain.  She is doing pretty well with this until November when it worsened.  This is about the same time as her thyroid dose was adjusted.  Her TSH level was still elevated on 112 mcg of levothyroxine and when she increased to 125 her TSH normalized but her face pain worsened.   Past Medical History:  Diagnosis Date  . B12 deficiency 08/03/2009   Overview:  Vitamin B12 Deficiency  . Hypothyroidism 08/01/2009   Overview:  Hypothyroidism  10/1 IMO update  . Trigeminal neuralgia 09/26/2009   Overview:  Trigeminal Neuralgia  . Vitamin D deficiency 09/26/2009   Overview:  Vitamin D Deficiency  10/1 IMO update  . Vitreous detachment of right eye 08/07/2016   Past Surgical History:  Procedure Laterality Date  . TONSILLECTOMY AND ADENOIDECTOMY     Social History   Tobacco Use  . Smoking status: Current Every Day Smoker  . Smokeless tobacco: Never Used  Substance Use Topics  . Alcohol use: No     ROS:  As above   Medications: Current Outpatient Medications  Medication Sig Dispense Refill  . fluticasone  (FLONASE) 50 MCG/ACT nasal spray Place 1 spray into both nostrils daily. 48 g 3  . levothyroxine (SYNTHROID, LEVOTHROID) 125 MCG tablet Take 1 tablet (125 mcg total) by mouth daily. 90 tablet 1  . phenytoin (DILANTIN) 100 MG ER capsule Take 3 capsules (300 mg total) by mouth daily. 270 capsule 1  . triamcinolone cream (KENALOG) 0.1 % Apply 1 application topically 2 (two) times daily. 453.6 g 12   No current facility-administered medications for this visit.    Allergies  Allergen Reactions  . Penicillins Other (See Comments) and Hives  . Topiramate     seizures  . Duloxetine Hcl Diarrhea and Nausea Only  . Gabapentin Diarrhea and Nausea Only  . Levetiracetam Diarrhea and Nausea Only  . Moxifloxacin Hcl In Nacl     hypothermia  . Pregabalin Diarrhea and Nausea Only  . Thimerosal      Exam:  BP 123/79   Pulse 71   Ht 5\' 5"  (1.651 m)   Wt 130 lb (59 kg)   BMI 21.63 kg/m  General: Well Developed, well nourished, and in no acute distress.  Neuro/Psych: Alert and oriented x3, extra-ocular muscles intact, able to move all 4 extremities, sensation grossly intact. Skin: Warm and dry, no rashes noted.  Respiratory: Not using accessory muscles, speaking in full sentences, trachea midline.  Cardiovascular: Pulses palpable, no extremity edema. Abdomen: Does not appear distended. MSK:  Left shoulder: Normal-appearing  nontender. Significantly limited range of motion.  Abduction limited active and passive to 100 degrees.  External rotation limited to 30 degrees.  Internal rotation limited to the iliac crest Strength is diminished in internal/external and abduction due to pain Hawkins and Neer's test are not able to be done due to inability to tolerate positioning Pulses capillary refill and sensation are intact.  My independent review of the x-ray left shoulder reveals no acute fractures.  Minimal to absent degenerative changes.  Awaiting for radiology review   Lab Results  Component  Value Date   TSH 2.35 03/20/2017      Assessment and Plan: 62 y.o. female with  Left shoulder pain highly concerning for both adhesive capsulitis and likely rotator cuff tear. Based on the fact that her pain and symptoms have been ongoing now for 2 months occurred with trauma and are interfering with her ability to work and take care of herself at home I think it is reasonable to proceed with an MRI to evaluate the cause of her shoulder pain and dysfunction.  We will also start working on some home exercise program in the meantime follow-up after MRI.  As for her face pain and how it relates to her TSH I do not think it is very likely but it is reasonable to decrease her levothyroxine dose back down to 112 mcg and see how her symptoms change.     Orders Placed This Encounter  Procedures  . DG Shoulder Left    Standing Status:   Future    Number of Occurrences:   1    Standing Expiration Date:   06/28/2018    Order Specific Question:   Reason for Exam (SYMPTOM  OR DIAGNOSIS REQUIRED)    Answer:   eval left shoulder pain after fall suspect RTC tear. Eval fracture    Order Specific Question:   Preferred imaging location?    [facility]    Order Specific Question:   Radiology Contrast Protocol - do NOT remove file path    Answer:   \\[email or web address]  . MR Shoulder Left Wo Contrast    Standing Status:   Future    Standing Expiration Date:   06/28/2018    Order Specific Question:   What is the patient's sedation requirement?    Answer:   No Sedation    Order Specific Question:   Does the patient have a pacemaker or implanted devices?    Answer:   No    Order Specific Question:   Preferred imaging location?    Answer:   [facility] (table limit-350lbs)    Order Specific Question:   Radiology Contrast Protocol - do NOT remove file path    Answer:   \\[email or web address]   No orders of the  defined types were placed in this encounter.   Discussed warning signs or symptoms. Please see discharge instructions. Patient expresses understanding.

## 2017-04-29 NOTE — Patient Instructions (Addendum)
Thank you for coming in today. You should hear about MRI shoulder soon.  Recheck after MRI.  Think about decrease the levothyroxine dose of 112 and see if your face pain improves.     Adhesive Capsulitis Adhesive capsulitis is inflammation of the tendons and ligaments that surround the shoulder joint (shoulder capsule). This condition causes the shoulder to become stiff and painful to move. Adhesive capsulitis is also called frozen shoulder. What are the causes? This condition may be caused by:  An injury to the shoulder joint.  Straining the shoulder.  Not moving the shoulder for a period of time. This can happen if your arm was injured or in a sling.  Long-standing health problems, such as: ? Diabetes. ? Thyroid problems. ? Heart disease. ? Stroke. ? Rheumatoid arthritis. ? Lung disease.  In some cases, the cause may not be known. What increases the risk? This condition is more likely to develop in:  Women.  People who are older than 63 years of age.  What are the signs or symptoms? Symptoms of this condition include:  Pain in the shoulder when moving the arm. There may also be pain when parts of the shoulder are touched. The pain is worse at night or when at rest.  Soreness or aching in the shoulder.  Inability to move the shoulder normally.  Muscle spasms.  How is this diagnosed? This condition is diagnosed with a physical exam and imaging tests, such as an X-ray or MRI. How is this treated? This condition may be treated with:  Treatment of the underlying cause or condition.  Physical therapy. This involves performing exercises to get the shoulder moving again.  Medicine. Medicine may be given to relieve pain, inflammation, or muscle spasms.  Steroid injections into the shoulder joint.  Shoulder manipulation. This is a procedure to move the shoulder into another position. It is done after you are given a medicine to make you fall asleep (general  anesthetic). The joint may also be injected with salt water at high pressure to break down scarring.  Surgery. This may be done in severe cases when other treatments have failed.  Although most people recover completely from adhesive capsulitis, some may not regain the full movement of the shoulder. Follow these instructions at home:  Take over-the-counter and prescription medicines only as told by your health care provider.  If you are being treated with physical therapy, follow instructions from your physical therapist.  Avoid exercises that put a lot of demand on your shoulder, such as throwing. These exercises can make pain worse.  If directed, apply ice to the injured area: ? Put ice in a plastic bag. ? Place a towel between your skin and the bag. ? Leave the ice on for 20 minutes, 2-3 times per day. Contact a health care provider if:  You develop new symptoms.  Your symptoms get worse. This information is not intended to replace advice given to you by your health care provider. Make sure you discuss any questions you have with your health care provider. Document Released: 12/31/2008 Document Revised: 08/11/2015 Document Reviewed: 06/28/2014 Elsevier Interactive Patient Education  [employer]2018 [employer] Inc[employer]

## 2017-05-06 ENCOUNTER — Ambulatory Visit (INDEPENDENT_AMBULATORY_CARE_PROVIDER_SITE_OTHER): Payer: BLUE CROSS/BLUE SHIELD

## 2017-05-06 DIAGNOSIS — M25512 Pain in left shoulder: Secondary | ICD-10-CM

## 2017-05-06 DIAGNOSIS — M7582 Other shoulder lesions, left shoulder: Secondary | ICD-10-CM

## 2017-05-06 DIAGNOSIS — S4992XA Unspecified injury of left shoulder and upper arm, initial encounter: Secondary | ICD-10-CM | POA: Diagnosis not present

## 2017-05-07 ENCOUNTER — Ambulatory Visit: Payer: BLUE CROSS/BLUE SHIELD | Admitting: Family Medicine

## 2017-05-07 ENCOUNTER — Encounter: Payer: Self-pay | Admitting: Family Medicine

## 2017-05-07 VITALS — BP 143/71 | HR 64 | Wt 130.0 lb

## 2017-05-07 DIAGNOSIS — M7582 Other shoulder lesions, left shoulder: Secondary | ICD-10-CM

## 2017-05-07 DIAGNOSIS — M7522 Bicipital tendinitis, left shoulder: Secondary | ICD-10-CM

## 2017-05-07 DIAGNOSIS — G500 Trigeminal neuralgia: Secondary | ICD-10-CM | POA: Diagnosis not present

## 2017-05-07 MED ORDER — PREDNISONE 5 MG (48) PO TBPK
ORAL_TABLET | ORAL | 0 refills | Status: DC
Start: 2017-05-07 — End: 2017-05-21

## 2017-05-07 NOTE — Patient Instructions (Signed)
Thank you for coming in today. Attend PT.  Take prednisone.  Recheck with me in 4-6 weeks.  Return sooner if needed.

## 2017-05-08 ENCOUNTER — Other Ambulatory Visit: Payer: Self-pay | Admitting: Family Medicine

## 2017-05-08 DIAGNOSIS — G500 Trigeminal neuralgia: Secondary | ICD-10-CM

## 2017-05-08 NOTE — Progress Notes (Signed)
Briana Henderson is a 62 y.o. female who presents to [facility] today for follow up right shoulder pain.   Briana Henderson was seen last week for right shoulder pain concerning for adhesive capsulitis and possibly a torn rotator cuff tendon.  She try to do her home exercise program and notes that it is very painful and difficult to do. She notes pain in the left shoulder worse with activity. She denies any significant radiating pain to the left hand. She has tried OTC medications which have not helped much for pain. She had a MRI yesterday and is here for follow up.   She also has been seen for trigeminal neuralgia. She notes that her face pain continues to be bothersome.    Past Medical History:  Diagnosis Date  . B12 deficiency 08/03/2009   Overview:  Vitamin B12 Deficiency  . Hypothyroidism 08/01/2009   Overview:  Hypothyroidism  10/1 IMO update  . Trigeminal neuralgia 09/26/2009   Overview:  Trigeminal Neuralgia  . Vitamin D deficiency 09/26/2009   Overview:  Vitamin D Deficiency  10/1 IMO update  . Vitreous detachment of right eye 08/07/2016   Past Surgical History:  Procedure Laterality Date  . TONSILLECTOMY AND ADENOIDECTOMY     Social History   Tobacco Use  . Smoking status: Current Every Day Smoker  . Smokeless tobacco: Never Used  Substance Use Topics  . Alcohol use: No     ROS:  As above   Medications: Current Outpatient Medications  Medication Sig Dispense Refill  . fluticasone (FLONASE) 50 MCG/ACT nasal spray Place 1 spray into both nostrils daily. 48 g 3  . levothyroxine (SYNTHROID, LEVOTHROID) 125 MCG tablet Take 1 tablet (125 mcg total) by mouth daily. 90 tablet 1  . phenytoin (DILANTIN) 100 MG ER capsule Take 3 capsules (300 mg total) by mouth daily. 270 capsule 1  . triamcinolone cream (KENALOG) 0.1 % Apply 1 application topically 2 (two) times daily. 453.6 g 12  . predniSONE (STERAPRED UNI-PAK 48 TAB) 5 MG (48) TBPK  tablet 12 day dosepack po 48 tablet 0   No current facility-administered medications for this visit.    Allergies  Allergen Reactions  . Penicillins Other (See Comments) and Hives  . Topiramate     seizures  . Duloxetine Hcl Diarrhea and Nausea Only  . Gabapentin Diarrhea and Nausea Only  . Levetiracetam Diarrhea and Nausea Only  . Moxifloxacin Hcl In Nacl     hypothermia  . Pregabalin Diarrhea and Nausea Only  . Thimerosal      Exam:  BP (!) 143/71   Pulse 64   Wt 130 lb (59 kg)   BMI 21.63 kg/m  General: Well Developed, well nourished, and in no acute distress.  Neuro/Psych: Alert and oriented x3, extra-ocular muscles intact, able to move all 4 extremities, sensation grossly intact. Left face TTP normal motion.  Skin: Warm and dry, no rashes noted.  Respiratory: Not using accessory muscles, speaking in full sentences, trachea midline.  Cardiovascular: Pulses palpable, no extremity edema. Abdomen: Does not appear distended. MSK: left shoulder. Elevated compared to the right. Decreased ROM due to pain. Strength intact but testing limited due to guarding.    MRI images independently reviewed.  No results found for this or any previous visit (from the past 48 hour(s)). Mr Shoulder Left Wo Contrast  Result Date: 05/06/2017 CLINICAL DATA:  Status post fall. Decreased range of motion. Unable to lift arm. Weakness. EXAM: MRI OF THE LEFT  SHOULDER WITHOUT CONTRAST TECHNIQUE: Multiplanar, multisequence MR imaging of the shoulder was performed. No intravenous contrast was administered. COMPARISON:  None. FINDINGS: Rotator cuff: Mild tendinosis of the supraspinatus tendon. Infraspinatus tendon is intact. Teres minor tendon is intact. Subscapularis tendon is intact. Muscles: No atrophy or fatty replacement of nor abnormal signal within, the muscles of the rotator cuff. Biceps long head: Mild tendinosis of the intraarticular portion of the long head of the biceps tendon. Acromioclavicular  Joint: Mild arthropathy of the acromioclavicular joint. Type I acromion. No subacromial/subdeltoid bursal fluid. Glenohumeral Joint: No joint effusion.  No chondral defect. Labrum: Grossly intact, but evaluation is limited by lack of intraarticular fluid. Bones:  No acute osseous abnormality.  No aggressive osseous lesion. Other: No fluid collection or hematoma. IMPRESSION: 1. Mild tendinosis of the supraspinatus tendon. 2. Mild tendinosis of the intraarticular portion of the long head of the biceps tendon. Electronically Signed   By: Elige KoHetal  Patel   On: 05/06/2017 12:14      Assessment and Plan: 62 y.o. female with left shoulder pain due to RTC tendonitis.  We had a lengthy discussion about diagnosis and treatment plan. [name] is very fearful of injections and would like to try oral prednisone first. She is also reluctant to attend PT due to difficulty getting to PT appointment.  However she failed home exercises.  Plan for formal PT and prednisone. If not better soon then will return for injection.   Trigeminal Neuralgia: Will use long prednisone taper to hopefully avoid steroid rebound pain.    Orders Placed This Encounter  Procedures  . Ambulatory referral to Physical Therapy    Referral Priority:   Routine    Referral Type:   Physical Medicine    Referral Reason:   Specialty Services Required    Requested Specialty:   Physical Therapy   Meds ordered this encounter  Medications  . predniSONE (STERAPRED UNI-PAK 48 TAB) 5 MG (48) TBPK tablet    Sig: 12 day dosepack po    Dispense:  48 tablet    Refill:  0    Discussed warning signs or symptoms. Please see discharge instructions. Patient expresses understanding.  I spent 25 minutes with this patient, greater than 50% was face-to-face time counseling regarding ddx and treatment plan.

## 2017-05-14 ENCOUNTER — Ambulatory Visit: Payer: BLUE CROSS/BLUE SHIELD | Admitting: Rehabilitative and Restorative Service Providers"

## 2017-05-14 ENCOUNTER — Encounter: Payer: Self-pay | Admitting: Rehabilitative and Restorative Service Providers"

## 2017-05-14 DIAGNOSIS — R29898 Other symptoms and signs involving the musculoskeletal system: Secondary | ICD-10-CM | POA: Diagnosis not present

## 2017-05-14 DIAGNOSIS — R293 Abnormal posture: Secondary | ICD-10-CM | POA: Diagnosis not present

## 2017-05-14 DIAGNOSIS — M25512 Pain in left shoulder: Secondary | ICD-10-CM | POA: Diagnosis not present

## 2017-05-14 NOTE — Patient Instructions (Addendum)
Axial Extension (Chin Tuck)    Pull chin in and lengthen back of neck. Hold __5__ seconds while counting out loud. Repeat _5-_10__ times. Do __several__ sessions per day.  Shoulder Blade Squeeze    Rotate shoulders back, then squeeze shoulder blades down and back Hold 10 sec  Repeat _10___ times. Do __2-3 __ sessions per day.   Pendulum Circular    Bend forward 90 at waist, leaning on table for support. Rock body in a circular pattern to move arm clockwise _30___ times then counterclockwise _30__ times. Do _several ___ sessions per day.  Pulley  10 reps hold 10 sec each 3-4 times a day  TENS UNIT: This is helpful for muscle pain and spasm.   Search and Purchase a TENS 7000 2nd edition at www.tenspros.com. It should be less than $30.     TENS unit instructions: Do not shower or bathe with the unit on Turn the unit off before removing electrodes or batteries If the electrodes lose stickiness add a drop of water to the electrodes after they are disconnected from the unit and place on plastic sheet. If you continued to have difficulty, call the TENS unit company to purchase more electrodes. Do not apply lotion on the skin area prior to use. Make sure the skin is clean and dry as this will help prolong the life of the electrodes. After use, always check skin for unusual red areas, rash or other skin difficulties. If there are any skin problems, does not apply electrodes to the same area. Never remove the electrodes from the unit by pulling the wires. Do not use the TENS unit or electrodes other than as directed. Do not change electrode placement without consultating your therapist or physician. Keep 2 fingers with between each electrode.     [facility] [address] [place], [place] [address]  [phone] (office) [phone] (fax)

## 2017-05-14 NOTE — Therapy (Signed)
[facility] [place] [address] [place], [place], [address] Phone: [phone]   Fax:  [phone]  Physical Therapy Evaluation  Patient Details  Name: Briana Henderson MRN: [id number] Date of Birth: 09-Sep-1955 Referring Provider: Dr Denyse Amass    Encounter Date: 05/14/2017  PT End of Session - 05/14/17 1131    Visit Number  1    Number of Visits  12    Date for PT Re-Evaluation  06/25/17    PT Start Time  1131    PT Stop Time  1233    PT Time Calculation (min)  62 min    Activity Tolerance  Patient tolerated treatment well       Past Medical History:  Diagnosis Date  . B12 deficiency 08/03/2009   Overview:  Vitamin B12 Deficiency  . Hypothyroidism 08/01/2009   Overview:  Hypothyroidism  10/1 IMO update  . Trigeminal neuralgia 09/26/2009   Overview:  Trigeminal Neuralgia  . Vitamin D deficiency 09/26/2009   Overview:  Vitamin D Deficiency  10/1 IMO update  . Vitreous detachment of right eye 08/07/2016    Past Surgical History:  Procedure Laterality Date  . TONSILLECTOMY AND ADENOIDECTOMY      There were no vitals filed for this visit.   Subjective Assessment - 05/14/17 1142    Subjective  Patient reports that she fell while playing in the snow 02/25/17. she fell onto the Lt elbow. She had some pain at the time of injury but symptoms increased significantly the end of January.     Pertinent History  Trigeminal neuralgia - Lt facial pain and HA on an intermittent basis; vitrous detachment Rt eye 18 months ago     Diagnostic tests  xrays     Patient Stated Goals  get rid of the shoulder pain and wear bra comfortably     Currently in Pain?  Yes    Pain Score  4     Pain Location  Shoulder    Pain Orientation  Left;Anterior    Pain Descriptors / Indicators  Burning;Dull    Pain Type  Acute pain    Pain Radiating Towards  into the front of the arm not past elbow     Pain Onset  More than a month ago    Pain Frequency   Intermittent    Aggravating Factors   lying down to sleep; using Lt UE for functional activities     Pain Relieving Factors  meds; hot shower; not using Lt arm          [facility] PT Assessment - 05/14/17 0001      Assessment   Medical Diagnosis  Lt rotator cuff tendonitis; biceps tendonitis; adhesive capsulitis     Referring Provider  Dr Denyse Amass     Onset Date/Surgical Date  02/25/17    Hand Dominance  Right    Next MD Visit  06/28/17    Prior Therapy  none      Precautions   Precautions  None      Balance Screen   Has the patient fallen in the past 6 months  Yes    How many times?  5 trigeminal neuralgia and meds create dizzyness     Has the patient had a decrease in activity level because of a fear of falling?   No    Is the patient reluctant to leave their home because of a fear of falling?   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     Leisure  household chores; quilting;       Observation/Other Assessments   Focus on Therapeutic Outcomes (FOTO)   53% limiation       Sensation   Additional Comments  intermittently tingling and change in temp Lt hand - glove like       Posture/Postural Control   Posture Comments  head forward; shoudlers rounded and elevated; head of the humerus anterior in orientation; scapulae abducted and rotated along the thoracic wall       AROM   Right/Left Shoulder  -- assessed in standing rotation at 80-90 deg abd    Right Shoulder Extension  55 Degrees    Right Shoulder Flexion  129 Degrees    Right Shoulder ABduction  146 Degrees    Right Shoulder Internal Rotation  44 Degrees    Right Shoulder External Rotation  80 Degrees    Left Shoulder Extension  38 Degrees    Left Shoulder Flexion  88 Degrees    Left Shoulder ABduction  69 Degrees    Left Shoulder  Internal Rotation  34 Degrees    Left Shoulder External Rotation  0 Degrees    Cervical Flexion  60    Cervical Extension  42    Cervical - Right Side Bend  29    Cervical - Left Side Bend  27    Cervical - Right Rotation  57    Cervical - Left Rotation  53      Strength   Strength Assessment Site  -- pain with all resistive testing Lt UE     Right Shoulder Flexion  4+/5    Right Shoulder Extension  4+/5    Right Shoulder ABduction  4+/5    Right Shoulder Internal Rotation  5/5    Right Shoulder External Rotation  4+/5    Right Shoulder Horizontal ABduction  4/5    Left Shoulder Flexion  3-/5    Left Shoulder Extension  4/5    Left Shoulder ABduction  4/5    Left Shoulder Internal Rotation  4/5    Left Shoulder External Rotation  3/5    Left Shoulder Horizontal ABduction  3+/5      Palpation   Spinal mobility  hypomobile cervical and thoracic spine     Palpation comment  muscular tightness Lt shoulder girdle - anterior/posterior              Objective measurements completed on examination: See above findings.      [facility] Adult PT Treatment/Exercise - 05/14/17 0001      Neuro Re-ed    Neuro Re-ed Details   postural correction       Shoulder Exercises: Standing   Other Standing Exercises  scap squeeze 10 sec x 10; axial extension 10 sec x 5 with swim noodle      Shoulder Exercises: Pulleys   Flexion  -- 10 sec hold x 10 reps to pt tolerance    ABduction  -- painful - unable to do       Moist Heat Therapy   Number Minutes Moist Heat  20 Minutes    Moist Heat Location  Shoulder thoracic spine      Electrical Stimulation  Electrical Stimulation Location  Lt shoulder     Electrical Stimulation Action  IFC    Electrical Stimulation Parameters  to tolerance    Electrical Stimulation Goals  Pain;Tone             PT Education - 05/14/17 1246    Education provided  Yes    Education Details  HEP TENS     Person(s) Educated  Patient    Methods   Explanation;Demonstration;Tactile cues;Verbal cues;Handout    Comprehension  Verbalized understanding;Returned demonstration;Verbal cues required;Tactile cues required          PT Long Term Goals - 05/14/17 1304      PT LONG TERM GOAL #1   Title  Improve posture and alignment with patient to demonstrate improved upright posture with activation of posterior shoudler girdle musculature 06/25/17    Time  6    Period  Weeks    Status  New      PT LONG TERM GOAL #2   Title  Increase AROM Lt shoulder to functional range with minimal to no pain(no more than 2/10) 06/25/17    Time  6    Period  Weeks    Status  New      PT LONG TERM GOAL #3   Title  Decrease pain Lt shoulder to no more than 2/10 with movement - allowing patient to use Lt UE for functinal activities and wear bra comfortably 06/25/17    Time  6    Period  Weeks    Status  New      PT LONG TERM GOAL #4   Title  Independent in HEP 06/25/17    Time  6    Period  Weeks    Status  New      PT LONG TERM GOAL #5   Title  Improve FOTO to </= 39% limitation 06/25/17    Time  6    Period  Weeks    Status  New             Plan - 05/14/17 1255    Clinical Impression Statement  Olegario Messier presents with Lt shoulder pain following injury 02/25/17 when she fell in the snow landing on her elbow. She had siginficant increase in symptoms the end of January and symptoms persist. Patient has poor posture and alignment; limited ROM cervical/thoracic spine and Lt shoulder; abnormal movement patterns Lt UE; pain with all palpation Lt shoulder girdle musculature; pain with functional activities and pain with sleeping.     Clinical Presentation  Evolving    Clinical Decision Making  Low    Rehab Potential  Good    PT Frequency  2x / week    PT Duration  6 weeks    PT Treatment/Interventions  Patient/family education;ADLs/Self Care Home Management;Cryotherapy;Electrical Stimulation;Iontophoresis 4mg /ml Dexamethasone;Moist Heat;Ultrasound;Dry  needling;Manual techniques;Neuromuscular re-education;Therapeutic activities;Therapeutic exercise    PT Next Visit Plan  review HEP; progress with AAROM exercises (ER with cane, table slide, shoulder extension, etc); manual work Lt shoulder girdle; modalities as indicated     Consulted and Agree with Plan of Care  Patient       Patient will benefit from skilled therapeutic intervention in order to improve the following deficits and impairments:  Postural dysfunction, Improper body mechanics, Pain, Increased fascial restricitons, Increased muscle spasms, Impaired UE functional use, Decreased range of motion, Decreased strength, Decreased activity tolerance, Hypomobility  Visit Diagnosis: Acute pain of left shoulder - Plan: PT plan of care cert/re-cert  Other  symptoms and signs involving the musculoskeletal system - Plan: PT plan of care cert/re-cert  Abnormal posture - Plan: PT plan of care cert/re-cert     Problem List Patient Active Problem List   Diagnosis Date Noted  . Rotator cuff tendonitis, left 05/07/2017  . Biceps tendonitis on left 05/07/2017  . Thyroid nodule 11/23/2016  . Palpitations 11/23/2016  . Chronic pain of left ankle 10/04/2016  . Vitreous detachment of right eye 08/07/2016  . Imbalance 08/07/2016  . Trigeminal neuralgia 09/26/2009  . Vitamin D deficiency 09/26/2009  . B12 deficiency 08/03/2009  . Fatigue 08/01/2009  . Hypothyroidism 08/01/2009    Vanesa Renier Rober MinionP Jameir Ake PT, MPH  05/14/2017, 1:10 PM  [facility] [address] [place], [place], [address] Phone: [phone]   Fax:  [phone]  Name: Lorayne BenderKatherine Vanliew MRN: [id number] Date of Birth: Sep 16, 1955

## 2017-05-21 ENCOUNTER — Encounter: Payer: Self-pay | Admitting: Rehabilitative and Restorative Service Providers"

## 2017-05-21 ENCOUNTER — Ambulatory Visit: Payer: BLUE CROSS/BLUE SHIELD | Admitting: Rehabilitative and Restorative Service Providers"

## 2017-05-21 ENCOUNTER — Encounter: Payer: Self-pay | Admitting: Family Medicine

## 2017-05-21 ENCOUNTER — Ambulatory Visit: Payer: BLUE CROSS/BLUE SHIELD | Admitting: Family Medicine

## 2017-05-21 VITALS — BP 122/80 | HR 74 | Wt 124.0 lb

## 2017-05-21 DIAGNOSIS — M7522 Bicipital tendinitis, left shoulder: Secondary | ICD-10-CM

## 2017-05-21 DIAGNOSIS — R29898 Other symptoms and signs involving the musculoskeletal system: Secondary | ICD-10-CM

## 2017-05-21 DIAGNOSIS — M25512 Pain in left shoulder: Secondary | ICD-10-CM | POA: Diagnosis not present

## 2017-05-21 DIAGNOSIS — M7582 Other shoulder lesions, left shoulder: Secondary | ICD-10-CM | POA: Diagnosis not present

## 2017-05-21 DIAGNOSIS — R293 Abnormal posture: Secondary | ICD-10-CM

## 2017-05-21 MED ORDER — PREDNISONE 5 MG (48) PO TBPK: ORAL_TABLET | ORAL | 0 refills | Status: DC

## 2017-05-21 NOTE — Progress Notes (Signed)
Briana Henderson is a 62 y.o. female who presents to [facility] Briana Henderson: Primary Care Sports Medicine today for follow-up left shoulder pain.follow up shoulder pain. Briana Henderson notes continued shoulder pain due to RTC tendonitis and biceps tendonitis seen on MRI. She has been doing PT and notes continued severe pain interfering with PT. she is very reluctant to consider steroid injection and had a trial of oral steroids which did not help.  She takes Tylenol prior to physical therapy which helped only a little for pain control.  She notes that she cannot take NSAIDs and is very reluctant to use opiates for pain control as they cause constipation for her.  She is back early today because the pain is interfering with physical therapy.   Past Medical History:  Diagnosis Date  . B12 deficiency 08/03/2009   Overview:  Vitamin B12 Deficiency  . Hypothyroidism 08/01/2009   Overview:  Hypothyroidism  10/1 IMO update  . Trigeminal neuralgia 09/26/2009   Overview:  Trigeminal Neuralgia  . Vitamin D deficiency 09/26/2009   Overview:  Vitamin D Deficiency  10/1 IMO update  . Vitreous detachment of right eye 08/07/2016   Past Surgical History:  Procedure Laterality Date  . TONSILLECTOMY AND ADENOIDECTOMY     Social History   Tobacco Use  . Smoking status: Current Every Day Smoker  . Smokeless tobacco: Never Used  Substance Use Topics  . Alcohol use: No   family history includes Cancer in her paternal grandfather; Diabetes in her brother, father, mother, and sister; Heart disease in her father, maternal grandfather, maternal grandmother, and mother; Hyperlipidemia in her mother; Hypertension in her father and mother.  ROS as above:  Medications: Current Outpatient Medications  Medication Sig Dispense Refill  . fluticasone (FLONASE) 50 MCG/ACT nasal spray Place 1 spray into both nostrils daily. 48 g 3  .  levothyroxine (SYNTHROID, LEVOTHROID) 125 MCG tablet Take 1 tablet (125 mcg total) by mouth daily. 90 tablet 1  . phenytoin (DILANTIN) 100 MG ER capsule TAKE 3 CAPSULES DAILY 270 capsule 1  . triamcinolone cream (KENALOG) 0.1 % Apply 1 application topically 2 (two) times daily. 453.6 g 12   No current facility-administered medications for this visit.    Allergies  Allergen Reactions  . Penicillins Other (See Comments) and Hives  . Topiramate     seizures  . Duloxetine Hcl Diarrhea and Nausea Only  . Gabapentin Diarrhea and Nausea Only  . Levetiracetam Diarrhea and Nausea Only  . Moxifloxacin Hcl In Nacl     hypothermia  . Pregabalin Diarrhea and Nausea Only  . Thimerosal     Health Maintenance Health Maintenance  Topic Date Due  . PAP SMEAR  02/06/1976  . INFLUENZA VACCINE  11/23/2017 (Originally 10/17/2016)  . TETANUS/TDAP  04/29/2018 (Originally 02/05/1974)  . MAMMOGRAM  05/08/2018 (Originally 02/05/2005)  . COLONOSCOPY  05/08/2018 (Originally 02/05/2005)  . Hepatitis C Screening  05/08/2018 (Originally [birth year])  . HIV Screening  05/08/2018 (Originally 02/05/1970)     Exam:  BP 122/80   Pulse 74   Wt 124 lb (56.2 kg)   BMI 20.63 kg/m  Gen: Well NAD Left shoulder: Normal appearing. TTP biceps groove. Decreased ROM.  Intact sensation distally.   MRI left shoulder 05/06/17  IMPRESSION: 1. Mild tendinosis of the supraspinatus tendon. 2. Mild tendinosis of the intraarticular portion of the long head of the biceps tendon. I personally (independently) visualized and performed the interpretation of the images attached in this note.  Assessment and Plan: 62 y.o. female with Left shoulder pain due to RTC tendonitis. We had a lengthy discussion about plan. I think the next most reasonable step at this time is to proceed with subacromial steroid injection. She does not want to do that. I then offered referral to orthopedic surgery which she really does not want to  do.  We settled on a plan of a 1-2 week trial of rest at home. If not better in 1-2 weeks and unable to resume PT next step is injection.  Recheck in 1-2 weeks or so.     Discussed warning signs or symptoms. Please see discharge instructions. Patient expresses understanding.  I spent 15 minutes with this patient, greater than 50% was face-to-face time counseling regarding treatment plan.

## 2017-05-21 NOTE — Patient Instructions (Signed)
Thank you for coming in today. Continue activity for pain relief.  Use a sling for 1-2 weeks only Resume PT activity after 1-2 weeks.  If not better recheck for likely injection.

## 2017-05-21 NOTE — Therapy (Signed)
[facility] [place] [address] [place], [place], [address] Phone: [phone]   Fax:  [phone]  Physical Therapy Treatment  Patient Details  Name: Briana Henderson MRN: [id number] Date of Birth: 09-25-1955 Referring Provider: Dr Denyse Amass    Encounter Date: 05/21/2017  PT End of Session - 05/21/17 1015    Visit Number  2    Number of Visits  12    Date for PT Re-Evaluation  06/25/17    PT Start Time  1015    PT Stop Time  1116    PT Time Calculation (min)  61 min    Activity Tolerance  Patient tolerated treatment well;Patient limited by pain       Past Medical History:  Diagnosis Date  . B12 deficiency 08/03/2009   Overview:  Vitamin B12 Deficiency  . Hypothyroidism 08/01/2009   Overview:  Hypothyroidism  10/1 IMO update  . Trigeminal neuralgia 09/26/2009   Overview:  Trigeminal Neuralgia  . Vitamin D deficiency 09/26/2009   Overview:  Vitamin D Deficiency  10/1 IMO update  . Vitreous detachment of right eye 08/07/2016    Past Surgical History:  Procedure Laterality Date  . TONSILLECTOMY AND ADENOIDECTOMY      There were no vitals filed for this visit.  Subjective Assessment - 05/21/17 1018    Subjective  Patient reports continued pain in the Lt shoulder which seems worse than he has been. She is doing exercises once a day. Can't tolerate more than that. Her husband made her a pulley and she is using that once a day but is able to do only 5-7 reps. She is using the TENS unit and heating pad at home. She is having difficulty sleeping and doing any functional activities at home. Briana Messier reports numbness in Lt hand and pain in the elbow. Lt hand and arm are often cold to touch.      Currently in Pain?  Yes    Pain Score  6     Pain Location  Shoulder    Pain Orientation  Left;Anterior    Pain Descriptors / Indicators  Burning;Dull;Throbbing;Stabbing    Pain Type  Acute pain    Pain Radiating Towards  into the arm, elbow and  numbness into the Lt hand     Pain Onset  More than a month ago    Pain Frequency  Constant    Aggravating Factors   lying down to sleep; using Lt UE for functional activities     Pain Relieving Factors  meds; hot shower; TENS; not using the Lt arm          [facility] PT Assessment - 05/21/17 0001      Assessment   Medical Diagnosis  Lt rotator cuff tendonitis; biceps tendonitis; adhesive capsulitis     Referring Provider  Dr Denyse Amass     Onset Date/Surgical Date  02/25/17    Hand Dominance  Right    Next MD Visit  06/28/17    Prior Therapy  none      PROM   Right/Left Shoulder  -- pain with all Lt shoulder motions     Left Shoulder Flexion  120 Degrees    Left Shoulder ABduction  113 Degrees    Left Shoulder External Rotation  71 Degrees in scapular plane       Palpation   Palpation comment  muscular tightness Lt shoulder girdle - anterior/posterior  [facility] Adult PT Treatment/Exercise - 05/21/17 0001      Moist Heat Therapy   Number Minutes Moist Heat  15 Minutes heat before and after manual work and PROM     Moist Heat Location  Shoulder thoracic spine      Electrical Stimulation   Electrical Stimulation Location  Lt shoulder     Electrical Stimulation Action  TENS    Electrical Stimulation Parameters  to tolerance    Electrical Stimulation Goals  Pain;Tone      Manual Therapy   Manual therapy comments  pt supine     Joint Mobilization  gentle joint mobs/circumduction     Soft tissue mobilization  Lt shoulder girdle through pecs, upper trap, leveator, deltoid; biceps; biceps    Scapular Mobilization  Lt scapula     Passive ROM  Lt shoulder flexion, abduction, ER in scapular plane                   PT Long Term Goals - 05/21/17 1016      PT LONG TERM GOAL #1   Title  Improve posture and alignment with patient to demonstrate improved upright posture with activation of posterior shoudler girdle musculature 06/25/17    Time  6    Period   Weeks    Status  On-going      PT LONG TERM GOAL #2   Title  Increase AROM Lt shoulder to functional range with minimal to no pain(no more than 2/10) 06/25/17    Time  6    Period  Weeks    Status  On-going      PT LONG TERM GOAL #3   Title  Decrease pain Lt shoulder to no more than 2/10 with movement - allowing patient to use Lt UE for functinal activities and wear bra comfortably 06/25/17    Time  6    Period  Weeks    Status  On-going      PT LONG TERM GOAL #4   Title  Independent in HEP 06/25/17    Time  6    Period  Weeks    Status  On-going      PT LONG TERM GOAL #5   Title  Improve FOTO to </= 39% limitation 06/25/17    Time  6    Period  Weeks    Status  On-going            Plan - 05/21/17 1141    Clinical Impression Statement  Briana Henderson returns for her second visit to PT today with continued and worsening pain in Lt shoudler. She is doing her exercises (pulley, table slide, pendulum, scap squeeze) only once a day. She is using the TENS unit and heat for home to manage pain but symptoms seemto be increasing. Briana Henderson reports pain into the Lt UE as well as numbness and sensation of cold into the Lt hand on an intermittent basis. She has difficulty sleeping and is unable to use Lt UE for functional activities. Briana Henderson has difficulty tolerating any exercise or manual work/ROM by PT. Pain is limiting participation in PT and her HEP. Suggested Briana Henderson return to Dr Denyse Amassorey for f/u with pain management.     Rehab Potential  Good    PT Frequency  2x / week    PT Duration  6 weeks    PT Treatment/Interventions  Patient/family education;ADLs/Self Care Home Management;Cryotherapy;Electrical Stimulation;Iontophoresis 4mg /ml Dexamethasone;Moist Heat;Ultrasound;Dry needling;Manual techniques;Neuromuscular re-education;Therapeutic activities;Therapeutic exercise    PT Next Visit  Plan  review HEP; progress with AAROM exercises (ER with cane, table slide, shoulder extension, etc); manual work Lt shoulder  girdle; modalities as indicated     Consulted and Agree with Plan of Care  Patient       Patient will benefit from skilled therapeutic intervention in order to improve the following deficits and impairments:  Postural dysfunction, Improper body mechanics, Pain, Increased fascial restricitons, Increased muscle spasms, Impaired UE functional use, Decreased range of motion, Decreased strength, Decreased activity tolerance, Hypomobility  Visit Diagnosis: Acute pain of left shoulder  Other symptoms and signs involving the musculoskeletal system  Abnormal posture     Problem List Patient Active Problem List   Diagnosis Date Noted  . Rotator cuff tendonitis, left 05/07/2017  . Biceps tendonitis on left 05/07/2017  . Thyroid nodule 11/23/2016  . Palpitations 11/23/2016  . Chronic pain of left ankle 10/04/2016  . Vitreous detachment of right eye 08/07/2016  . Imbalance 08/07/2016  . Trigeminal neuralgia 09/26/2009  . Vitamin D deficiency 09/26/2009  . B12 deficiency 08/03/2009  . Fatigue 08/01/2009  . Hypothyroidism 08/01/2009    Qusai Kem Rober Minion PT, MPH  05/21/2017, 11:48 AM  [facility] [address] [place], [place], [address] Phone: [phone]   Fax:  [phone]  Name: Briana Henderson MRN: [id number] Date of Birth: May 15, 1955

## 2017-06-04 ENCOUNTER — Encounter: Payer: Self-pay | Admitting: Family Medicine

## 2017-06-04 ENCOUNTER — Encounter: Payer: Self-pay | Admitting: Rehabilitative and Restorative Service Providers"

## 2017-06-04 ENCOUNTER — Ambulatory Visit: Payer: BLUE CROSS/BLUE SHIELD | Admitting: Family Medicine

## 2017-06-04 ENCOUNTER — Ambulatory Visit: Payer: BLUE CROSS/BLUE SHIELD | Admitting: Rehabilitative and Restorative Service Providers"

## 2017-06-04 VITALS — BP 142/86 | HR 62 | Ht 65.0 in | Wt 124.0 lb

## 2017-06-04 DIAGNOSIS — G500 Trigeminal neuralgia: Secondary | ICD-10-CM | POA: Diagnosis not present

## 2017-06-04 DIAGNOSIS — R29898 Other symptoms and signs involving the musculoskeletal system: Secondary | ICD-10-CM | POA: Diagnosis not present

## 2017-06-04 DIAGNOSIS — E039 Hypothyroidism, unspecified: Secondary | ICD-10-CM | POA: Diagnosis not present

## 2017-06-04 DIAGNOSIS — M25512 Pain in left shoulder: Secondary | ICD-10-CM

## 2017-06-04 DIAGNOSIS — R293 Abnormal posture: Secondary | ICD-10-CM | POA: Diagnosis not present

## 2017-06-04 DIAGNOSIS — M7582 Other shoulder lesions, left shoulder: Secondary | ICD-10-CM

## 2017-06-04 LAB — T3, FREE: T3, Free: 2.4 pg/mL (ref 2.3–4.2)

## 2017-06-04 LAB — T4, FREE: FREE T4: 1.1 ng/dL (ref 0.8–1.8)

## 2017-06-04 LAB — TSH: TSH: 3.15 mIU/L (ref 0.40–4.50)

## 2017-06-04 NOTE — Patient Instructions (Addendum)
Thank you for coming in today. We will recheck labs.  Continue home exercises or formal PT.  Add the band work.  Recheck as needed.   We will check thyroid.   Try capsicin for your shoulder.

## 2017-06-04 NOTE — Therapy (Addendum)
[place], [place], [address] Phone: [phone]   Fax:  [phone]  Physical Therapy Treatment  Patient Details  Name: Briana Henderson MRN: [id number] Date of Birth: 10/25/1955 Referring Provider: Dr Georgina Snell    Encounter Date: 06/04/2017  PT End of Session - 06/04/17 1014    Visit Number  3    Number of Visits  12    Date for PT Re-Evaluation  06/25/17    PT Start Time  7026    PT Stop Time  1116    PT Time Calculation (min)  62 min    Activity Tolerance  Patient tolerated treatment well       Past Medical History:  Diagnosis Date  . B12 deficiency 08/03/2009   Overview:  Vitamin B12 Deficiency  . Hypothyroidism 08/01/2009   Overview:  Hypothyroidism  10/1 IMO update  . Trigeminal neuralgia 09/26/2009   Overview:  Trigeminal Neuralgia  . Vitamin D deficiency 09/26/2009   Overview:  Vitamin D Deficiency  10/1 IMO update  . Vitreous detachment of right eye 08/07/2016    Past Surgical History:  Procedure Laterality Date  . TONSILLECTOMY AND ADENOIDECTOMY      There were no vitals filed for this visit.  Subjective Assessment - 06/04/17 1015    Subjective  Patient reports that she is feeling some better. She has just been taking it easy for the past two weeks. She is using the pulley at home. She can feel a difference with the hot shower.     Currently in Pain?  Yes    Pain Score  2     Pain Location  Shoulder    Pain Orientation  Left;Anterior    Pain Descriptors / Indicators  Aching;Burning;Nagging;Throbbing    Pain Type  Acute pain    Pain Onset  More than a month ago    Pain Frequency  Intermittent    Aggravating Factors   lying down to sleep; some functional activities     Pain Relieving Factors  heat; hot shower; TENS some; not using the Lt arm          [facility] PT Assessment - 06/04/17 0001      Assessment   Medical Diagnosis  Lt rotator cuff tendonitis; biceps tendonitis;  adhesive capsulitis     Referring Provider  Dr Georgina Snell     Onset Date/Surgical Date  02/25/17    Hand Dominance  Right    Next MD Visit  06/28/17    Prior Therapy  none      AROM   Left Shoulder Extension  35 Degrees    Left Shoulder Flexion  125 Degrees    Left Shoulder ABduction  84 Degrees    Left Shoulder Internal Rotation  34 Degrees    Left Shoulder External Rotation  0 Degrees      Palpation   Palpation comment  muscular tightness Lt shoulder girdle - anterior/posterior                   [facility] Adult PT Treatment/Exercise - 06/04/17 0001      Shoulder Exercises: Supine   Flexion  AAROM;Left;Right 3 reps for ROM       Shoulder Exercises: Standing   Extension  Strengthening;Left;Right;10 reps;Theraband    Theraband Level (Shoulder Extension)  Level 1 (Yellow)    Row  Strengthening;Left;Right;10 reps;Theraband    Theraband Level (Shoulder Row)  Level 1 (Yellow)    Retraction  Strengthening;Left;Right;10 reps;Theraband  Theraband Level (Shoulder Retraction)  Level 1 (Yellow)    Other Standing Exercises  scap squeeze 10 sec x 10; axial extension 10 sec x 5 with swim noodle      Shoulder Exercises: Pulleys   Flexion  -- 10 reps 10 sec hold    ABduction  -- 10 sec x 10 reps       Shoulder Exercises: Stretch   Table Stretch - External Rotation  3 reps;10 seconds    Other Shoulder Stretches  biceps 20 sec x 2       Moist Heat Therapy   Number Minutes Moist Heat  20 Minutes    Moist Heat Location  Shoulder thoracic spine      Electrical Stimulation   Electrical Stimulation Location  Lt shoulder     Electrical Stimulation Action  IFC    Electrical Stimulation Parameters  to tolerance    Electrical Stimulation Goals  Pain;Tone      Ultrasound   Ultrasound Location  anterior Lt srm - biceps/deltoid     Ultrasound Parameters  1.5 w/cm2; 1 mHz; 100%; 8 min     Ultrasound Goals  Pain;Other (Comment) tightness       Manual Therapy   Manual therapy comments  pt  supine     Joint Mobilization  gentle joint mobs/circumduction     Soft tissue mobilization  Lt shoulder girdle through pecs, upper trap, leveator, deltoid; biceps; biceps    Scapular Mobilization  Lt scapula     Passive ROM  Lt shoulder flexion, abduction, ER in scapular plane              PT Education - 06/04/17 1042    Education provided  Yes    Education Details  HEP     Person(s) Educated  Patient    Methods  Explanation;Demonstration;Tactile cues;Verbal cues;Handout    Comprehension  Verbalized understanding;Returned demonstration;Verbal cues required;Tactile cues required          PT Long Term Goals - 06/04/17 1014      PT LONG TERM GOAL #1   Title  Improve posture and alignment with patient to demonstrate improved upright posture with activation of posterior shoudler girdle musculature 06/25/17    Time  6    Period  Weeks    Status  On-going      PT LONG TERM GOAL #2   Title  Increase AROM Lt shoulder to functional range with minimal to no pain(no more than 2/10) 06/25/17    Time  6    Period  Weeks    Status  On-going      PT LONG TERM GOAL #3   Title  Decrease pain Lt shoulder to no more than 2/10 with movement - allowing patient to use Lt UE for functinal activities and wear bra comfortably 06/25/17    Time  6    Period  Weeks    Status  On-going      PT LONG TERM GOAL #4   Title  Independent in HEP 06/25/17    Time  6    Period  Weeks    Status  On-going      PT LONG TERM GOAL #5   Title  Improve FOTO to </= 39% limitation 06/25/17    Time  6    Period  Weeks    Status  On-going            Plan - 06/04/17 1151    Clinical Impression Statement  Significant  improvement in symptoms since last visit. Good gains in ROM and decrease in pain. Patient tolerated increased exercise today and did well with pulley. Has pulley at home that she is using daily. Gradually progressing toward stated goals of therapy.     Rehab Potential  Good    PT Frequency  2x /  week    PT Duration  6 weeks    PT Treatment/Interventions  Patient/family education;ADLs/Self Care Home Management;Cryotherapy;Electrical Stimulation;Iontophoresis 21m/ml Dexamethasone;Moist Heat;Ultrasound;Dry needling;Manual techniques;Neuromuscular re-education;Therapeutic activities;Therapeutic exercise    PT Next Visit Plan  review HEP; progress with AAROM exercises (ER with cane, table slide, shoulder extension, etc); manual work Lt shoulder girdle; modalities as indicated     Consulted and Agree with Plan of Care  Patient       Patient will benefit from skilled therapeutic intervention in order to improve the following deficits and impairments:  Postural dysfunction, Improper body mechanics, Pain, Increased fascial restricitons, Increased muscle spasms, Impaired UE functional use, Decreased range of motion, Decreased strength, Decreased activity tolerance, Hypomobility  Visit Diagnosis: Acute pain of left shoulder  Other symptoms and signs involving the musculoskeletal system  Abnormal posture     Problem List Patient Active Problem List   Diagnosis Date Noted  . Rotator cuff tendonitis, left 05/07/2017  . Biceps tendonitis on left 05/07/2017  . Thyroid nodule 11/23/2016  . Palpitations 11/23/2016  . Chronic pain of left ankle 10/04/2016  . Vitreous detachment of right eye 08/07/2016  . Imbalance 08/07/2016  . Trigeminal neuralgia 09/26/2009  . Vitamin D deficiency 09/26/2009  . B12 deficiency 08/03/2009  . Fatigue 08/01/2009  . Hypothyroidism 08/01/2009    Sybella Harnish PNilda SimmerPT, MPH  06/04/2017, 11:54 AM  [facility] [address]Phone: [phone]  Fax:  [phone] Name: Briana ClementsMRN: [id number] of Birth: 1August 01, 1957 PHYSICAL THERAPY DISCHARGE SUMMARY  Visits from Start of Care: 3  Current functional level related to goals / functional outcomes: See last  progress note for discharge status    Remaining deficits: unknown   Education / Equipment: HEP  Plan: Patient agrees to discharge.  Patient goals were not met. Patient is being discharged due to not returning since the last visit.  ?????     Meliza Kage P. HHelene KelpPT, MPH 07/11/17 10:58 AM

## 2017-06-04 NOTE — Progress Notes (Signed)
Briana Henderson is a 62 y.o. female who presents to [facility] today for follow-up shoulder pain, thyroid, facial pain.  Briana Henderson has been seen several times for left shoulder pain due to rotator cuff tendinitis.  She is attended some physical therapy appointments and has been doing a lot of home exercises.  She notes mild improvement in symptoms but it still quite bothersome.  She is very resistant to the idea of an injection and would like to proceed with further conservative management.  She notes bothersome nighttime pain is her biggest problem.  The pain is interfering with sleep.  Additionally Briana Henderson notes a history of hypothyroidism.  Her levothyroxine dose was adjusted and was found to be stable current dose 3 months ago.. She notes however recently she started feeling hot and cold and some skin changes and is worried that her thyroid may be adjustment again.  Additionally Briana Henderson notes moderate to severe continued facial pain to trigeminal neuralgia has had multiple treatments for this in the past and is currently stable    Past Medical History:  Diagnosis Date  . B12 deficiency 08/03/2009   Overview:  Vitamin B12 Deficiency  . Hypothyroidism 08/01/2009   Overview:  Hypothyroidism  10/1 IMO update  . Trigeminal neuralgia 09/26/2009   Overview:  Trigeminal Neuralgia  . Vitamin D deficiency 09/26/2009   Overview:  Vitamin D Deficiency  10/1 IMO update  . Vitreous detachment of right eye 08/07/2016   Past Surgical History:  Procedure Laterality Date  . TONSILLECTOMY AND ADENOIDECTOMY     Social History   Tobacco Use  . Smoking status: Current Every Day Smoker  . Smokeless tobacco: Never Used  Substance Use Topics  . Alcohol use: No   family history includes Cancer in her paternal grandfather; Diabetes in her brother, father, mother, and sister; Heart  disease in her father, maternal grandfather, maternal grandmother, and mother; Hyperlipidemia in her mother; Hypertension in her father and mother.  ROS as above:  Medications: Current Outpatient Medications  Medication Sig Dispense Refill  . fluticasone (FLONASE) 50 MCG/ACT nasal spray Place 1 spray into both nostrils daily. 48 g 3  . levothyroxine (SYNTHROID, LEVOTHROID) 125 MCG tablet Take 1 tablet (125 mcg total) by mouth daily. 90 tablet 1  . phenytoin (DILANTIN) 100 MG ER capsule TAKE 3 CAPSULES DAILY 270 capsule 1  . triamcinolone cream (KENALOG) 0.1 % Apply 1 application topically 2 (two) times daily. 453.6 g 12   No current facility-administered medications for this visit.    Allergies  Allergen Reactions  . Penicillins Other (See Comments) and Hives  . Topiramate     seizures  . Duloxetine Hcl Diarrhea and Nausea Only  . Gabapentin Diarrhea and Nausea Only  . Levetiracetam Diarrhea and Nausea Only  . Moxifloxacin Hcl In Nacl     hypothermia  . Pregabalin Diarrhea and Nausea Only  . Thimerosal     Health Maintenance Health Maintenance  Topic Date Due  . PAP SMEAR  06/04/2017 (Originally 02/06/1976)  . INFLUENZA VACCINE  11/23/2017 (Originally 10/17/2016)  . TETANUS/TDAP  04/29/2018 (Originally 02/05/1974)  . MAMMOGRAM  05/08/2018 (Originally 02/05/2005)  . COLONOSCOPY  05/08/2018 (Originally 02/05/2005)  . Hepatitis C Screening  05/08/2018 (Originally [birth year])  . HIV Screening  05/08/2018 (Originally 02/05/1970)     Exam:  BP (!) 142/86   Pulse 62   Ht 5\' 5"  (1.651 m)   Wt 124 lb (56.2  kg)   BMI 20.63 kg/m  Gen: Well NAD HEENT: EOMI,  MMM Lungs: Normal work of breathing. CTABL Heart: RRR no MRG Abd: NABS, Soft. Nondistended, Nontender Exts: Brisk capillary refill, warm and well perfused.   Left shoulder normal-appearing decreased range of motion.    Lab Results  Component Value Date   TSH 2.35 03/20/2017    MRI left shoulder May 06, 2017  reviewed IMPRESSION: 1. Mild tendinosis of the supraspinatus tendon. 2. Mild tendinosis of the intraarticular portion of the long head of the biceps tendon.  I personally (independently) visualized and performed the interpretation of the images attached in this note.  Assessment and Plan: 62 y.o. female with  Left shoulder pain due to tendinitis.  Continue home exercise program consider cortisone shot in the near future.  Hypothyroidism: Recheck TSH T3 and free T4.  Adjust as needed.  Trigeminal neuralgia: Not much else to do here.  Continue watchful waiting.   Orders Placed This Encounter  Procedures  . TSH  . T3, free  . T4, free   No orders of the defined types were placed in this encounter.    Discussed warning signs or symptoms. Please see discharge instructions. Patient expresses understanding.

## 2017-06-04 NOTE — Patient Instructions (Addendum)
Resisted External Rotation: in Neutral - Bilateral   PALMS UP Sit or stand, tubing in both hands, elbows at sides, bent to 90, forearms forward. Pinch shoulder blades together and rotate forearms out. Keep elbows at sides. Repeat __10__ times per set. Do _2-3___ sets per session. Do _1-2__ sessions per day.   Low Row: Standing   Face anchor, feet shoulder width apart. Palms up, pull arms back, squeezing shoulder blades together. Repeat 10__ times per set. Do 2-3__ sets per session. Do 1-2__ sessions per day. Anchor Height: Waist   Strengthening: Resisted Extension   Hold tubing in right hand, arm forward. Pull arm back, elbow straight. Repeat _10___ times per set. Do 2-3____ sets per session. Do 1-2___ sessions per day.  ELBOW: Biceps - Standing    Standing in doorway, place one hand on wall, elbow straight. Lean forward. Hold _10-20__ seconds. _2-3__ reps per set, ___ 2-3vper day  ROM: External Rotation    Keeping left forearm palm down on table, bend forward at waist until stretch is felt. Hold __10-30__ seconds. Repeat __3-5__ times per set. Do __1-3__ sets per day.    Copyright  VHI. All rights reserved.

## 2017-06-25 ENCOUNTER — Encounter: Payer: Self-pay | Admitting: Physical Therapy

## 2017-06-25 ENCOUNTER — Ambulatory Visit: Payer: BLUE CROSS/BLUE SHIELD | Admitting: Family Medicine

## 2017-06-25 ENCOUNTER — Encounter: Payer: Self-pay | Admitting: Family Medicine

## 2017-06-25 VITALS — BP 126/81 | HR 71 | Ht 64.5 in | Wt 123.0 lb

## 2017-06-25 DIAGNOSIS — M7582 Other shoulder lesions, left shoulder: Secondary | ICD-10-CM

## 2017-06-25 DIAGNOSIS — G939 Disorder of brain, unspecified: Secondary | ICD-10-CM

## 2017-06-25 DIAGNOSIS — G500 Trigeminal neuralgia: Secondary | ICD-10-CM

## 2017-06-25 NOTE — Progress Notes (Signed)
Briana BenderKatherine Henderson is a 62 y.o. [facility] today for shoulder follow up.  Shoulder: Patient was diagnosed with left shoulder tendonitis one month ago. She was attending physical therapy and making improvements. One week ago at her physical therapy appointment she re-injured her shoulder during a deep tissue heat therapy performed by the therapist. Since then she has had constant pain and moderately limited range of motion. She still does not wish to proceed with invasive procedures such as surgery or injections.   Multiple Sclerosis concern: Patient has a history of trigeminal neuralgia and is worried of potentially developing multiple sclerosis. She reports no new neurological symptoms. She does state she feels a pressure in her ears and is concerned she may have a trigeminal neuralgia flare.    Past Medical History:  Diagnosis Date  . B12 deficiency 08/03/2009   Overview:  Vitamin B12 Deficiency  . Hypothyroidism 08/01/2009   Overview:  Hypothyroidism  10/1 IMO update  . Trigeminal neuralgia 09/26/2009   Overview:  Trigeminal Neuralgia  . Vitamin D deficiency 09/26/2009   Overview:  Vitamin D Deficiency  10/1 IMO update  . Vitreous detachment of right eye 08/07/2016   Past Surgical History:  Procedure Laterality Date  . TONSILLECTOMY AND ADENOIDECTOMY     Social History   Tobacco Use  . Smoking status: Current Every Day Smoker  . Smokeless tobacco: Never Used  Substance Use Topics  . Alcohol use: No   family history includes Cancer in her paternal grandfather; Diabetes in her brother, father, mother, and sister; Heart disease in her father, maternal grandfather, maternal grandmother, and mother; Hyperlipidemia in her mother; Hypertension in her father and mother.  ROS as above:  Medications: Current Outpatient Medications  Medication Sig  Dispense Refill  . fluticasone (FLONASE) 50 MCG/ACT nasal spray Place 1 spray into both nostrils daily. 48 g 3  . levothyroxine (SYNTHROID, LEVOTHROID) 125 MCG tablet Take 1 tablet (125 mcg total) by mouth daily. 90 tablet 1  . phenytoin (DILANTIN) 100 MG ER capsule TAKE 3 CAPSULES DAILY 270 capsule 1  . triamcinolone cream (KENALOG) 0.1 % Apply 1 application topically 2 (two) times daily. 453.6 g 12   No current facility-administered medications for this visit.    Allergies  Allergen Reactions  . Penicillins Other (See Comments) and Hives  . Topiramate     seizures  . Duloxetine Hcl Diarrhea and Nausea Only  . Gabapentin Diarrhea and Nausea Only  . Levetiracetam Diarrhea and Nausea Only  . Moxifloxacin Hcl In Nacl     hypothermia  . Pregabalin Diarrhea and Nausea Only  . Thimerosal     Health Maintenance Health Maintenance  Topic Date Due  . PAP SMEAR  02/06/1976  . INFLUENZA VACCINE  11/23/2017 (Originally 10/17/2017)  . TETANUS/TDAP  04/29/2018 (Originally 02/05/1974)  . MAMMOGRAM  05/08/2018 (Originally 02/05/2005)  . COLONOSCOPY  05/08/2018 (Originally 02/05/2005)  . Hepatitis C Screening  05/08/2018 (Originally Jun 27, 1955)  . HIV Screening  05/08/2018 (Originally 02/05/1970)     Exam:  BP 126/81   Pulse 71   Ht 5' 4.5" (1.638 m)   Wt 123 lb (55.8 kg)   BMI 20.79 kg/m  Gen: Well NAD HEENT: EOMI,  MMM. Left tympanic membrane slightly indented but not inflamed or purulent.  Lungs: Normal work of breathing.  Abd: NABS, Soft. Nondistended Exts: Brisk capillary refill, warm and well perfused.   MSK:  Left shoulder: Normal  appearing. TTP biceps groove. Decreased ROM.  Intact sensation distally.     MRI left shoulder 05/06/17  IMPRESSION: 1. Mild tendinosis of the supraspinatus tendon. 2. Mild tendinosis of the intraarticular portion of the long head of the biceps tendon. I personally (independently) visualized and performed the interpretation of the images  attached in this note.   No results found for this or any previous visit (from the past 72 hour(s)). No results found.    Assessment and Plan: 62 y.o. female with left shoulder pain.   Patient suffered a set back in her therapy. She was previously making progress with PT but due to a deep tissue heat massage she has been having pain for a weak. She still expresses wishes to not pursue invasive therapy and wishes to continue doing exercises at home. We discussed coming back for a possible injection if she does not improve, but at this time therapy will hopefully get her back to a baseline.   Patient was also concerned about progressive neurological disease. Because she has a history of trigeminal neuralgia and her own neurologist recently retired, I believe it is a good idea for her to establish care with a new neurologist to monitor her and alleviate her worry going forward. I will refer her to neurology.   Recheck in a few months.    Orders Placed This Encounter  Procedures  . Ambulatory referral to Neurology    Referral Priority:   Routine    Referral Type:   Consultation    Referral Reason:   Specialty Services Required    Requested Specialty:   Neurology    Number of Visits Requested:   1   No orders of the defined types were placed in this encounter.    Discussed warning signs or symptoms. Please see discharge instructions. Patient expresses understanding.

## 2017-06-25 NOTE — Progress Notes (Signed)
Note duplicated

## 2017-06-25 NOTE — Patient Instructions (Addendum)
Thank you for coming in today. Continue home exercises for shoulder pain.  You should hear from Neuro soon.  Recheck with me in 4-5 months or sooner if needed.   Take allergy medicine. Consider Flonase nasal spray.

## 2017-06-27 DIAGNOSIS — R002 Palpitations: Secondary | ICD-10-CM | POA: Diagnosis not present

## 2017-06-28 ENCOUNTER — Encounter: Payer: BLUE CROSS/BLUE SHIELD | Admitting: Rehabilitative and Restorative Service Providers"

## 2017-06-28 ENCOUNTER — Ambulatory Visit: Payer: BLUE CROSS/BLUE SHIELD | Admitting: Family Medicine

## 2017-08-30 DIAGNOSIS — H43813 Vitreous degeneration, bilateral: Secondary | ICD-10-CM | POA: Diagnosis not present

## 2017-08-30 DIAGNOSIS — H2513 Age-related nuclear cataract, bilateral: Secondary | ICD-10-CM | POA: Diagnosis not present

## 2017-08-30 DIAGNOSIS — H43391 Other vitreous opacities, right eye: Secondary | ICD-10-CM | POA: Diagnosis not present

## 2017-10-25 ENCOUNTER — Ambulatory Visit: Payer: BLUE CROSS/BLUE SHIELD | Admitting: Family Medicine

## 2017-11-04 ENCOUNTER — Other Ambulatory Visit: Payer: Self-pay | Admitting: Family Medicine

## 2017-11-04 DIAGNOSIS — G500 Trigeminal neuralgia: Secondary | ICD-10-CM

## 2017-11-07 ENCOUNTER — Ambulatory Visit (INDEPENDENT_AMBULATORY_CARE_PROVIDER_SITE_OTHER): Payer: BLUE CROSS/BLUE SHIELD | Admitting: Family Medicine

## 2017-11-07 ENCOUNTER — Encounter: Payer: Self-pay | Admitting: Family Medicine

## 2017-11-07 VITALS — BP 145/82 | HR 69 | Ht 65.5 in | Wt 117.0 lb

## 2017-11-07 DIAGNOSIS — G500 Trigeminal neuralgia: Secondary | ICD-10-CM

## 2017-11-07 DIAGNOSIS — E039 Hypothyroidism, unspecified: Secondary | ICD-10-CM | POA: Diagnosis not present

## 2017-11-07 DIAGNOSIS — M7501 Adhesive capsulitis of right shoulder: Secondary | ICD-10-CM | POA: Diagnosis not present

## 2017-11-07 DIAGNOSIS — L858 Other specified epidermal thickening: Secondary | ICD-10-CM

## 2017-11-07 DIAGNOSIS — M7502 Adhesive capsulitis of left shoulder: Secondary | ICD-10-CM

## 2017-11-07 DIAGNOSIS — M7500 Adhesive capsulitis of unspecified shoulder: Secondary | ICD-10-CM | POA: Insufficient documentation

## 2017-11-07 DIAGNOSIS — B078 Other viral warts: Secondary | ICD-10-CM | POA: Diagnosis not present

## 2017-11-07 MED ORDER — PREDNISONE 5 MG (48) PO TBPK: ORAL_TABLET | ORAL | 0 refills | Status: DC

## 2017-11-07 NOTE — Patient Instructions (Addendum)
Thank you for coming in today.   Neurology Sent referral with ov notes and insurance to [facility] at [phone] and [phone]. They will call and schedule with patient for good appointment time - CF  Try  Topical aspercream  Trolamine salicylate 10% . . . . . . . . . . . . Topical analgesic   Get labs today.  I will refill the medicine.   You will get skin pathology results soon.    Keep working on home exercises.   Recheck in 6 months or sooner if needed.

## 2017-11-07 NOTE — Progress Notes (Signed)
Briana Henderson is a 62 y.o. female who presents to [name]: Primary Care Sports Medicine today for follow-up shoulder pain, hypothyroidism, trigeminal neuralgia and discuss new skin lesion.  Briana Henderson was seen earlier this year in March and April for left shoulder pain thought to be rotator cuff tendinitis or potentially developing adhesive capsulitis.  She is very much against receiving steroid shoulder injections but has done reasonably well with oral steroid Dosepaks in the past.  She continues robust home exercise program which has been helpful.  She notes her left shoulder pain is still present and she still has limitations in her range of motion but overall she is improving.  She however notes new right shoulder pain present for a few months now.  She denies any injury in either shoulders recently.  She notes pain with abduction and internal rotation bilaterally.  Her pain and limitation in range of motion does limit her activities at home.  Hypothyroidism:[name] a history of hypothyroidism managed with levothyroxine.  She takes medications regularly.  She notes her nails are growing faster than usual but denies any other skin hair or nail changes.  She does not feel too hot or too cold.  Skin lesion: Briana Henderson has a scaly mildly firm whitish skin lesion growing out from her left chest wall over the last 2 weeks.  She notes it is irritated and frequently gets caught on seatbelts.  She denies any fevers or chills nausea vomiting or diarrhea.  She denies any new detergents soaps shampoos cosmetics.   ROS as above:  Exam:  BP (!) 145/82   Pulse 69   Ht 5' 5.5" (1.664 m)   Wt 117 lb (53.1 kg)   BMI 19.17 kg/m  Gen: Well NAD HEENT: EOMI,  MMM Lungs: Normal work of breathing. CTABL Heart: RRR no MRG Abd: NABS, Soft. Nondistended, Nontender Exts: Brisk capillary refill, warm and well perfused.   Skin: Small scaly 1 or 2 mm mildly firm lesion left chest wall.  No surrounding erythema. MSK: C-spine nontender with normal motion.  Left shoulder: Normal-appearing nontender. Range of motion Abduction 120 degrees Internal rotation limited to the iliac crest. External rotation 10 degrees beyond neutral position Positive Hawkins and Neer's test. Strength intact.  Right shoulder: Normal-appearing nontender. Range of motion Abduction 140 degrees Internal rotation lumbar spine External rotation 40 degrees beyond neutral position. Mildly positive Hawkins and Neer's test. Strength intact.  Pulses cap refill and sensation are intact bilateral upper extremities.  Shave biopsy: Consent obtained timeout performed. Skin cleaned with alcohol. Cold spray applied. 1 ml of Lidocaine with epinephrine injected achieving good anesthesia. Skin was again cleaned with alcohol. Forcep used to grasp lesion and elevate skin. A deep shave biopsy was used to remove the skin lesion. A dressing was applied. Patient tolerated the procedure well.   Assessment and Plan: 62 y.o. female with  Shoulder pain bilaterally very likely adhesive capsulitis.  Patient reluctant for steroid injection.  Consider iontophoresis and physical therapy.  Patient will let me know if she like to pursue that.  In the meantime continue home physical therapy activities as well as trial of oral prednisone Dosepak.  Hypothyroidism: Typically quite well managed with levothyroxine.  Patient does have some nail changes.  Plan to check TSH as well as basic metabolic panel and other labs.  Skin lesion chest wall: Consistent with cutaneous horn or possible irritated verruca or skin tag.  Shave biopsy obtained today.  Pathology pending.  Orders Placed This Encounter  Procedures  . CBC  . COMPLETE METABOLIC PANEL WITH GFR  . TSH   Meds ordered this encounter  Medications  . predniSONE (STERAPRED UNI-PAK 48 TAB) 5 MG (48) TBPK  tablet    Sig: 12 day dosepack po    Dispense:  48 tablet    Refill:  0     Historical information moved to improve visibility of documentation.  Past Medical History:  Diagnosis Date  . B12 deficiency 08/03/2009   Overview:  Vitamin B12 Deficiency  . Hypothyroidism 08/01/2009   Overview:  Hypothyroidism  10/1 IMO update  . Trigeminal neuralgia 09/26/2009   Overview:  Trigeminal Neuralgia  . Vitamin D deficiency 09/26/2009   Overview:  Vitamin D Deficiency  10/1 IMO update  . Vitreous detachment of right eye 08/07/2016   Past Surgical History:  Procedure Laterality Date  . TONSILLECTOMY AND ADENOIDECTOMY     Social History   Tobacco Use  . Smoking status: Current Every Day Smoker  . Smokeless tobacco: Never Used  Substance Use Topics  . Alcohol use: No   family history includes Cancer in her paternal grandfather; Diabetes in her brother, father, mother, and sister; Heart disease in her father, maternal grandfather, maternal grandmother, and mother; Hyperlipidemia in her mother; Hypertension in her father and mother.  Medications: Current Outpatient Medications  Medication Sig Dispense Refill  . fluticasone (FLONASE) 50 MCG/ACT nasal spray Place 1 spray into both nostrils daily. 48 g 3  . levothyroxine (SYNTHROID, LEVOTHROID) 125 MCG tablet Take 1 tablet (125 mcg total) by mouth daily. 90 tablet 1  . phenytoin (DILANTIN) 100 MG ER capsule TAKE 3 CAPSULES DAILY 270 capsule 4  . triamcinolone cream (KENALOG) 0.1 % Apply 1 application topically 2 (two) times daily. 453.6 g 12  . predniSONE (STERAPRED UNI-PAK 48 TAB) 5 MG (48) TBPK tablet 12 day dosepack po 48 tablet 0   No current facility-administered medications for this visit.    Allergies  Allergen Reactions  . Penicillins Other (See Comments) and Hives  . Topiramate     seizures  . Duloxetine Hcl Diarrhea and Nausea Only  . Gabapentin Diarrhea and Nausea Only  . Levetiracetam Diarrhea and Nausea Only  . Moxifloxacin  Hcl In Nacl     hypothermia  . Pregabalin Diarrhea and Nausea Only  . Thimerosal      Discussed warning signs or symptoms. Please see discharge instructions. Patient expresses understanding.

## 2017-11-08 LAB — CBC
HCT: 41.2 % (ref 35.0–45.0)
Hemoglobin: 14.2 g/dL (ref 11.7–15.5)
MCH: 30.5 pg (ref 27.0–33.0)
MCHC: 34.5 g/dL (ref 32.0–36.0)
MCV: 88.6 fL (ref 80.0–100.0)
MPV: 9.6 fL (ref 7.5–12.5)
PLATELETS: 368 10*3/uL (ref 140–400)
RBC: 4.65 10*6/uL (ref 3.80–5.10)
RDW: 13.2 % (ref 11.0–15.0)
WBC: 7.4 10*3/uL (ref 3.8–10.8)

## 2017-11-08 LAB — COMPLETE METABOLIC PANEL WITH GFR
AG RATIO: 2.4 (calc) (ref 1.0–2.5)
ALT: 11 U/L (ref 6–29)
AST: 14 U/L (ref 10–35)
Albumin: 4.7 g/dL (ref 3.6–5.1)
Alkaline phosphatase (APISO): 81 U/L (ref 33–130)
BILIRUBIN TOTAL: 0.3 mg/dL (ref 0.2–1.2)
BUN: 7 mg/dL (ref 7–25)
CALCIUM: 9.5 mg/dL (ref 8.6–10.4)
CHLORIDE: 104 mmol/L (ref 98–110)
CO2: 24 mmol/L (ref 20–32)
Creat: 0.79 mg/dL (ref 0.50–0.99)
GFR, EST NON AFRICAN AMERICAN: 80 mL/min/{1.73_m2} (ref >=60)
GFR, Est African American: 93 mL/min/{1.73_m2} (ref >=60)
GLUCOSE: 96 mg/dL (ref 65–139)
Globulin: 2 g/dL (calc) (ref 1.9–3.7)
POTASSIUM: 4.5 mmol/L (ref 3.5–5.3)
Sodium: 140 mmol/L (ref 135–146)
TOTAL PROTEIN: 6.7 g/dL (ref 6.1–8.1)

## 2017-11-08 LAB — TSH: TSH: 3.79 m[IU]/L (ref 0.40–4.50)

## 2017-11-19 ENCOUNTER — Other Ambulatory Visit: Payer: Self-pay | Admitting: Family Medicine

## 2017-11-28 ENCOUNTER — Encounter: Payer: Self-pay | Admitting: Family Medicine

## 2017-11-28 DIAGNOSIS — G500 Trigeminal neuralgia: Secondary | ICD-10-CM

## 2017-11-29 NOTE — Telephone Encounter (Signed)
Dr. Denyse Amassorey    Can you please put in a new referral the other one is from April and I will get it sent to one of the doctors she is requesting. Thank you

## 2017-12-03 ENCOUNTER — Other Ambulatory Visit: Payer: Self-pay | Admitting: Family Medicine

## 2017-12-26 DIAGNOSIS — G500 Trigeminal neuralgia: Secondary | ICD-10-CM | POA: Diagnosis not present

## 2017-12-26 DIAGNOSIS — H9190 Unspecified hearing loss, unspecified ear: Secondary | ICD-10-CM | POA: Diagnosis not present

## 2017-12-26 DIAGNOSIS — R29810 Facial weakness: Secondary | ICD-10-CM | POA: Diagnosis not present

## 2017-12-26 DIAGNOSIS — H9319 Tinnitus, unspecified ear: Secondary | ICD-10-CM | POA: Diagnosis not present

## 2018-01-09 DIAGNOSIS — H9319 Tinnitus, unspecified ear: Secondary | ICD-10-CM | POA: Diagnosis not present

## 2018-01-09 DIAGNOSIS — R29810 Facial weakness: Secondary | ICD-10-CM | POA: Diagnosis not present

## 2018-01-09 DIAGNOSIS — G500 Trigeminal neuralgia: Secondary | ICD-10-CM | POA: Diagnosis not present

## 2018-01-09 DIAGNOSIS — H9190 Unspecified hearing loss, unspecified ear: Secondary | ICD-10-CM | POA: Diagnosis not present

## 2018-01-16 DIAGNOSIS — H903 Sensorineural hearing loss, bilateral: Secondary | ICD-10-CM | POA: Diagnosis not present

## 2018-01-16 DIAGNOSIS — E063 Autoimmune thyroiditis: Secondary | ICD-10-CM | POA: Diagnosis not present

## 2018-01-16 DIAGNOSIS — G500 Trigeminal neuralgia: Secondary | ICD-10-CM | POA: Diagnosis not present

## 2018-01-16 DIAGNOSIS — H838X3 Other specified diseases of inner ear, bilateral: Secondary | ICD-10-CM | POA: Diagnosis not present

## 2018-01-16 DIAGNOSIS — H9313 Tinnitus, bilateral: Secondary | ICD-10-CM | POA: Diagnosis not present

## 2018-04-01 ENCOUNTER — Encounter: Payer: Self-pay | Admitting: Family Medicine

## 2018-04-01 ENCOUNTER — Ambulatory Visit (INDEPENDENT_AMBULATORY_CARE_PROVIDER_SITE_OTHER): Payer: BLUE CROSS/BLUE SHIELD | Admitting: Family Medicine

## 2018-04-01 VITALS — BP 130/74 | HR 69 | Ht 64.5 in | Wt 119.0 lb

## 2018-04-01 DIAGNOSIS — G500 Trigeminal neuralgia: Secondary | ICD-10-CM

## 2018-04-01 DIAGNOSIS — E039 Hypothyroidism, unspecified: Secondary | ICD-10-CM | POA: Diagnosis not present

## 2018-04-01 NOTE — Progress Notes (Signed)
Briana Henderson is a 63 y.o. female who presents to [facility]: Primary Care Sports Medicine today for follow-up trigeminal neuralgia and hypothyroidism.  Briana Henderson continues to experience pain in the left side of her face attributable to trigeminal neuralgia.  She takes medications listed below which helps a bit.  She is had work-up recently with neurology including a brain MRI which fortunately was relatively normal.  She thinks is about stable although she does not think that she is fully controlled.  Hypothyroidism: Patient takes levothyroxine listed below.  She does note that she feels cold and hot at times but thinks things have not changed.  She denies significant weight change.  She feels well otherwise no fevers chills nausea vomiting or diarrhea.   ROS as above:  Exam:  BP 130/74   Pulse 69   Ht 5' 4.5" (1.638 m)   Wt 119 lb (54 kg)   BMI 20.11 kg/m  Wt Readings from Last 5 Encounters:  04/01/18 119 lb (54 kg)  11/07/17 117 lb (53.1 kg)  06/25/17 123 lb (55.8 kg)  06/04/17 124 lb (56.2 kg)  05/21/17 124 lb (56.2 kg)    Gen: Well NAD HEENT: EOMI,  MMM Lungs: Normal work of breathing. CTABL Heart: RRR no MRG Abd: NABS, Soft. Nondistended, Nontender Exts: Brisk capillary refill, warm and well perfused.   Lab and Radiology Results MRI Head WO W IV Contrast10/25/2019 [facility] Result Impression  IMPRESSION:    No acute intracranial pathology identified.   Normal appearance of the bilateral trigeminal nerves.   Mild chronic small vessel ischemic change.  Electronically Signed by: Abner Greenspan  Result Narrative  MRI brain without and with contrast:  INDICATION: Facial weakness, trigeminal neuralgia, tinnitus  TECHNIQUE: Imaging of the brain was performed in 3 planes utilizing a combination of T1, FLAIR, and T2 weighting. Diffusion images were performed.  High-resolution T2 imaging was performed. In addition, sagittal and axial T1-weighted sequences were  performed after intravenous injection of 10 mL MultiHance.  COMPARISON: MRI May 10, 2011  FINDINGS: # Brain parenchyma: There are a few scattered foci of high T2/FLAIR signal abnormality within the periventricular white matter most commonly seen with small vessel ischemic changes. Trigeminal nerves appear normal without mass or signal abnormality. 7th  and 8th cranial nerves appear normal.  #  Diffusion-weighted images are normal indicating no acute infarct.   #  No evidence mass or hemorrhage. No abnormal enhancement. #  # Ventricles: Normal without dilatation, mass effect, or midline shift.  # Sella and parasellar region: Normal. # Craniovertebral junction: Normal.  # Mastoids and paranasal sinuses: Clear. # Orbits: Normal  # Major cerebral vessels and dural sinuses: Patent. # Bony structures and scalp: Unremarkable.    Lab Results  Component Value Date   TSH 3.79 11/07/2017      Assessment and Plan: 63 y.o. female with  Trigeminal neuralgia: Doing reasonably well.  Symptom control is stable and likely maximized.  Continue current regimen and recheck in about 6 months.  Hypothyroidism: Also doing quite well.  TSH normal at check about 4 to 5 months ago.  Continue regimen and recheck labs in person in about 6 months.  PDMP not reviewed this encounter. No orders of the defined types were placed in this encounter.  No orders of the defined types were placed in this encounter.    Historical information moved to improve visibility of documentation.  Past Medical History:  Diagnosis Date  .  B12 deficiency 08/03/2009   Overview:  Vitamin B12 Deficiency  . Hypothyroidism 08/01/2009   Overview:  Hypothyroidism  10/1 IMO update  . Trigeminal neuralgia 09/26/2009   Overview:  Trigeminal Neuralgia  . Vitamin D deficiency 09/26/2009   Overview:  Vitamin D  Deficiency  10/1 IMO update  . Vitreous detachment of right eye 08/07/2016   Past Surgical History:  Procedure Laterality Date  . TONSILLECTOMY AND ADENOIDECTOMY     Social History   Tobacco Use  . Smoking status: Current Every Day Smoker  . Smokeless tobacco: Never Used  Substance Use Topics  . Alcohol use: No   family history includes Cancer in her paternal grandfather; Diabetes in her brother, father, mother, and sister; Heart disease in her father, maternal grandfather, maternal grandmother, and mother; Hyperlipidemia in her mother; Hypertension in her father and mother.  Medications: Current Outpatient Medications  Medication Sig Dispense Refill  . fluticasone (FLONASE) 50 MCG/ACT nasal spray USE 1 SPRAY IN EACH NOSTRIL DAILY 48 g 3  . levothyroxine (SYNTHROID, LEVOTHROID) 125 MCG tablet TAKE 1 TABLET DAILY 90 tablet 4  . phenytoin (DILANTIN) 100 MG ER capsule TAKE 3 CAPSULES DAILY 270 capsule 4  . predniSONE (STERAPRED UNI-PAK 48 TAB) 5 MG (48) TBPK tablet 12 day dosepack po 48 tablet 0  . triamcinolone cream (KENALOG) 0.1 % Apply 1 application topically 2 (two) times daily. 453.6 g 12   No current facility-administered medications for this visit.    Allergies  Allergen Reactions  . Penicillins Other (See Comments) and Hives  . Topiramate     seizures  . Duloxetine Hcl Diarrhea and Nausea Only  . Gabapentin Diarrhea and Nausea Only  . Levetiracetam Diarrhea and Nausea Only  . Moxifloxacin Hcl In Nacl     hypothermia  . Pregabalin Diarrhea and Nausea Only  . Thimerosal      Discussed warning signs or symptoms. Please see discharge instructions. Patient expresses understanding.

## 2018-04-01 NOTE — Patient Instructions (Signed)
Thank you for coming in today. Continue current medicines.  Recheck as needed.

## 2018-04-09 ENCOUNTER — Other Ambulatory Visit: Payer: Self-pay

## 2018-04-09 DIAGNOSIS — G500 Trigeminal neuralgia: Secondary | ICD-10-CM

## 2018-04-09 MED ORDER — LEVOTHYROXINE SODIUM 125 MCG PO TABS: 125.0000 ug | ORAL_TABLET | Freq: Every day | ORAL | 4 refills | Status: AC

## 2018-04-09 MED ORDER — PHENYTOIN SODIUM EXTENDED 100 MG PO CAPS: 300.0000 mg | ORAL_CAPSULE | Freq: Every day | ORAL | 4 refills | Status: AC

## 2018-04-09 MED ORDER — FLUTICASONE PROPIONATE 50 MCG/ACT NA SUSP: 1.0000 | Freq: Every day | NASAL | 3 refills | Status: AC

## 2018-04-09 MED ORDER — FAMOTIDINE 20 MG PO TABS: 20.0000 mg | ORAL_TABLET | Freq: Every day | ORAL | 0 refills | Status: AC | PRN

## 2018-04-18 IMAGING — MR MR SHOULDER*L* W/O CM
5 series · 40 of 40 positions shown · non-contrast
Comparison: None.

CLINICAL DATA: Status post fall. Decreased range of motion. Unable
to lift arm. Weakness.

EXAM:
MRI OF THE LEFT SHOULDER WITHOUT CONTRAST
TECHNIQUE: Multiplanar, multisequence MR imaging of the shoulder was performed.
No intravenous contrast was administered.

[Series 3: T2 fat-sat · axial · 4.0mm · 0.59mm/px · z∈[-19,+69]mm · 10 of 21 slices shown (1 of 3)]
[im 1/21]
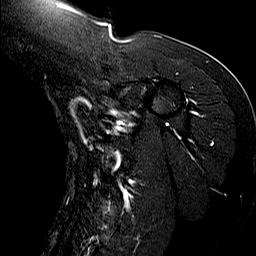
[im 3/21]
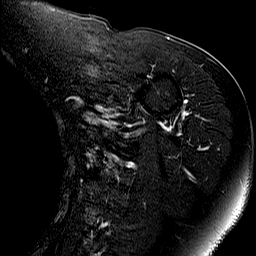
[im 5/21]
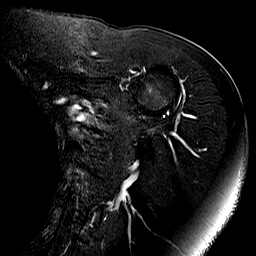
[im 7/21]
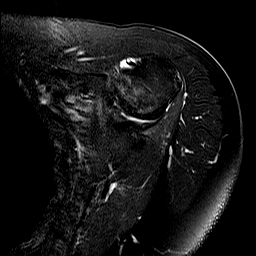
[im 9/21]
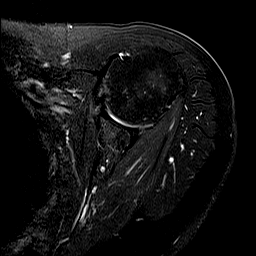
[im 12/21]
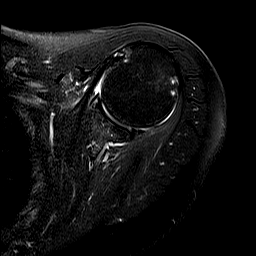
[im 14/21]
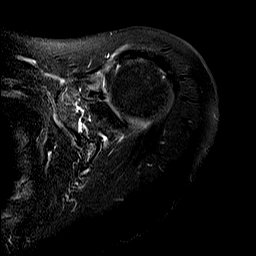
[im 16/21]
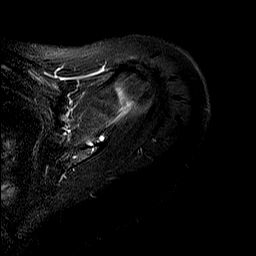
[im 18/21]
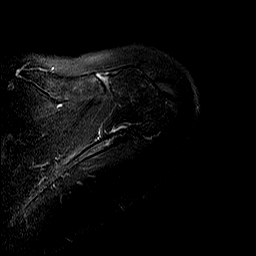
[im 21/21]
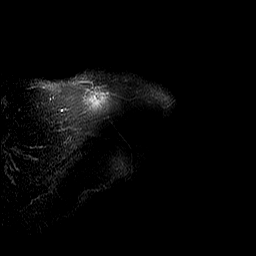

[Series 4: T2 fat-sat · oblique · 4.0mm · 0.59mm/px · 7 of 17 slices shown (2 of 3)]
[im 1/17]
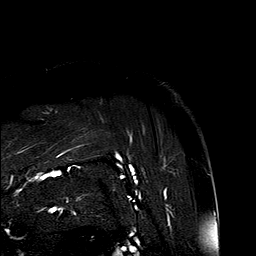
[im 3/17]
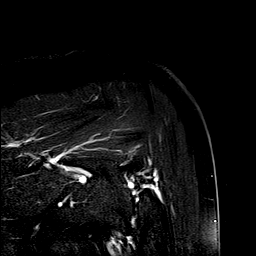
[im 6/17]
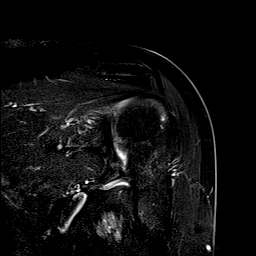
[im 9/17]
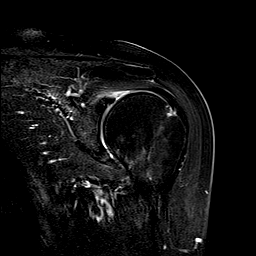
[im 11/17]
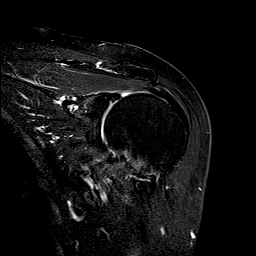
[im 14/17]
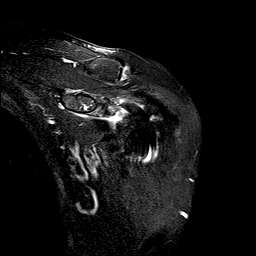
[im 17/17]
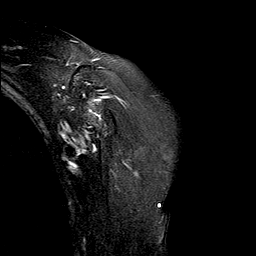

[Series 5: PD · oblique · 4.0mm · 0.59mm/px · 7 of 17 slices shown]
[im 1/17]
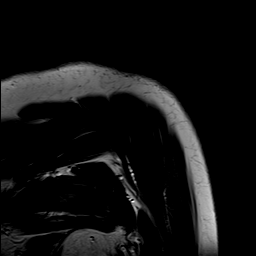
[im 3/17]
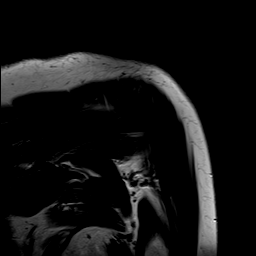
[im 6/17]
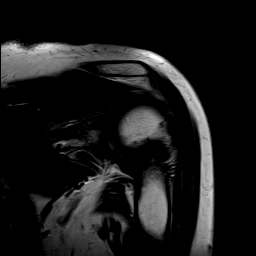
[im 9/17]
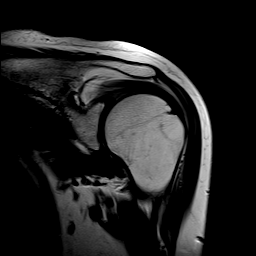
[im 11/17]
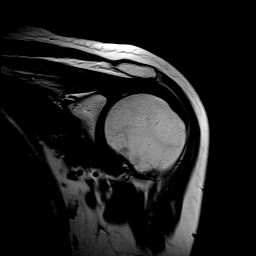
[im 14/17]
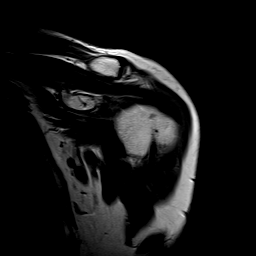
[im 17/17]
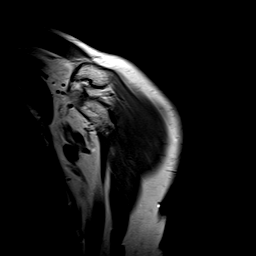

[Series 6: T1 · oblique · 4.0mm · 0.59mm/px · 8 of 20 slices shown]
[im 1/20]
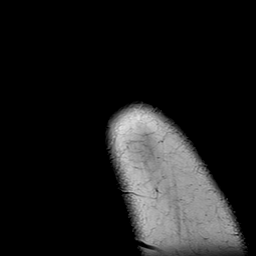
[im 3/20]
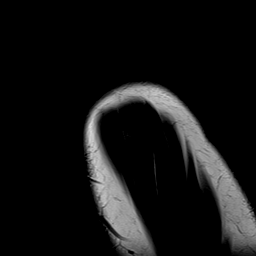
[im 6/20]
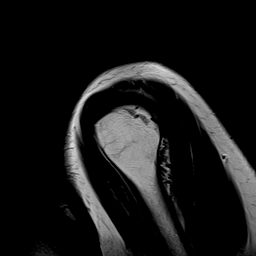
[im 9/20]
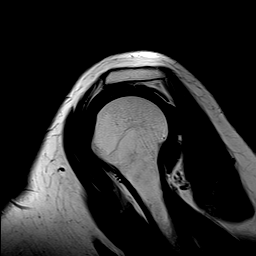
[im 11/20]
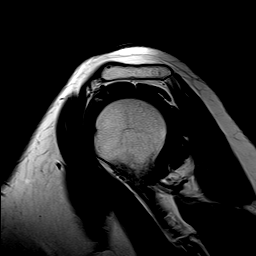
[im 14/20]
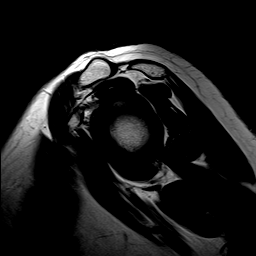
[im 17/20]
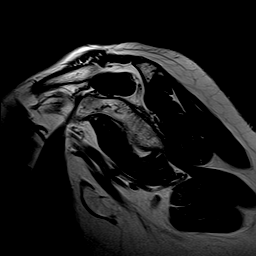
[im 20/20]
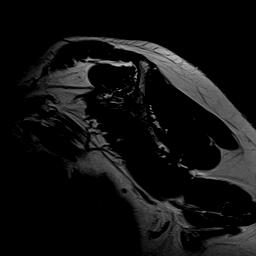

[Series 7: T2 fat-sat · oblique · 4.0mm · 0.59mm/px · 8 of 20 slices shown (3 of 3)]
[im 1/20]
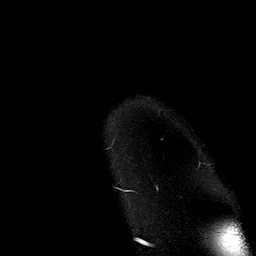
[im 3/20]
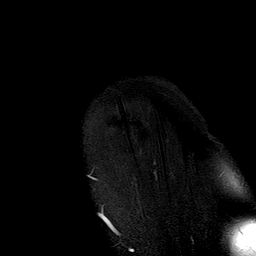
[im 6/20]
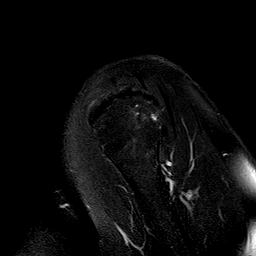
[im 9/20]
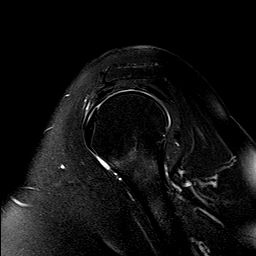
[im 11/20]
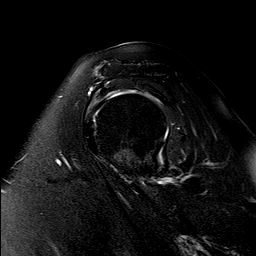
[im 14/20]
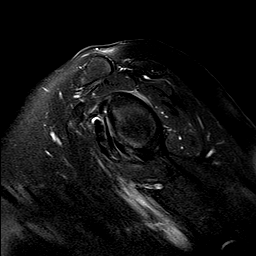
[im 17/20]
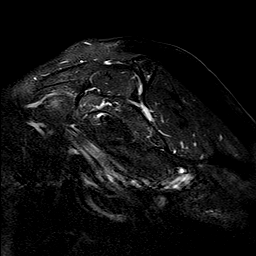
[im 20/20]
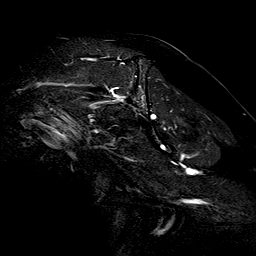

[40 of 40 positions shown; findings below may reference images not displayed]

FINDINGS: Rotator cuff: Mild tendinosis of the supraspinatus tendon.
Infraspinatus tendon is intact. Teres minor tendon is intact.
Subscapularis tendon is intact.

Muscles: No atrophy or fatty replacement of nor abnormal signal
within, the muscles of the rotator cuff.

Biceps long head: Mild tendinosis of the intraarticular portion of
the long head of the biceps tendon.

Acromioclavicular Joint: Mild arthropathy of the acromioclavicular
joint. Type I acromion. No subacromial/subdeltoid bursal fluid.

Glenohumeral Joint: No joint effusion.  No chondral defect.

Labrum: Grossly intact, but evaluation is limited by lack of
intraarticular fluid.

Bones:  No acute osseous abnormality.  No aggressive osseous lesion.

Other: No fluid collection or hematoma.
IMPRESSION: 1. Mild tendinosis of the supraspinatus tendon.
2. Mild tendinosis of the intraarticular portion of the long head of
the biceps tendon.

## 2018-05-12 ENCOUNTER — Ambulatory Visit: Payer: Medicare Other | Admitting: Family Medicine

## 2018-09-09 ENCOUNTER — Telehealth: Payer: Self-pay | Admitting: Family Medicine

## 2018-09-09 NOTE — Telephone Encounter (Signed)
Will be seen on 7/14. Will get labs then. Would like COVID test

## 2018-09-30 ENCOUNTER — Other Ambulatory Visit: Payer: Self-pay

## 2018-09-30 ENCOUNTER — Ambulatory Visit (INDEPENDENT_AMBULATORY_CARE_PROVIDER_SITE_OTHER): Payer: Medicare Other | Admitting: Family Medicine

## 2018-09-30 ENCOUNTER — Encounter: Payer: Self-pay | Admitting: Family Medicine

## 2018-09-30 VITALS — BP 128/71 | HR 62 | Temp 98.2°F | Wt 113.0 lb

## 2018-09-30 DIAGNOSIS — E559 Vitamin D deficiency, unspecified: Secondary | ICD-10-CM

## 2018-09-30 DIAGNOSIS — E538 Deficiency of other specified B group vitamins: Secondary | ICD-10-CM

## 2018-09-30 DIAGNOSIS — L819 Disorder of pigmentation, unspecified: Secondary | ICD-10-CM

## 2018-09-30 DIAGNOSIS — E041 Nontoxic single thyroid nodule: Secondary | ICD-10-CM

## 2018-09-30 DIAGNOSIS — R5383 Other fatigue: Secondary | ICD-10-CM | POA: Diagnosis not present

## 2018-09-30 DIAGNOSIS — G500 Trigeminal neuralgia: Secondary | ICD-10-CM

## 2018-09-30 DIAGNOSIS — E039 Hypothyroidism, unspecified: Secondary | ICD-10-CM

## 2018-09-30 DIAGNOSIS — Z5181 Encounter for therapeutic drug level monitoring: Secondary | ICD-10-CM

## 2018-09-30 DIAGNOSIS — L820 Inflamed seborrheic keratosis: Secondary | ICD-10-CM | POA: Diagnosis not present

## 2018-09-30 NOTE — Progress Notes (Signed)
Briana Henderson is a 63 y.o. female who [name] today for skin lesion left trunk, hypothyroidism, trigeminal neuralgia.  Briana Henderson notes a hyperpigmented about 1 cm skin lesion on her left flank.  This has been present for about a year and has become more thicker and somewhat dried out in appearance.  She notes that frequent gets caught on her clothes and is annoying and irritated and sometimes painful.  She notes is not growing much.  She feels well otherwise with no fevers or chills.  Additionally she takes levothyroxine for hypothyroidism.  Her dose has not changed.  She notes that she is lost a little bit of weight unintentionally recently.  She denies feeling too hot or too cold and feels about the same as she has done.  Additionally she takes Dilantin for trigeminal neuralgia.  She tolerates this medication well.  She notes that it helps some for her trigeminal neuralgia symptoms.   ROS as above:  Exam:  BP 128/71   Pulse 62   Temp 98.2 F (36.8 C) (Oral)   Wt 113 lb (51.3 kg)   BMI 19.10 kg/m  Wt Readings from Last 5 Encounters:  09/30/18 113 lb (51.3 kg)  04/01/18 119 lb (54 kg)  11/07/17 117 lb (53.1 kg)  06/25/17 123 lb (55.8 kg)  06/04/17 124 lb (56.2 kg)    Gen: Well NAD HEENT: EOMI,  MMM Lungs: Normal work of breathing. CTABL Heart: RRR no MRG Abd: NABS, Soft. Nondistended, Nontender Exts: Brisk capillary refill, warm and well perfused.  Skin: About 1.1 cm circular raised hyperpigmented lesion left trunk.  Nontender.  No surrounding erythema.   Excisional biopsy hyperpigmented skin lesion. Consent obtained and timeout performed. Skin cleaned with chlorhexidine and cold spray applied.  7 mL of lidocaine with epinephrine injected subcutaneously achieving good anesthesia. Skin was again re-sterilized with chlorhexidine.  Elliptical incision along skin tension line with 1 mm margins.  Elliptical incision was deep enough to get into the fat layer underneath the hyperpigmented lesion. Minimal bleeding. Incision closed with 1 horizontal mattress suture and 2 simple interrupted sutures.  Dressing applied.    Assessment and Plan: 63 y.o. female with  Hyperpigmented skin lesion: Based on appearance the skin lesion is probably an seborrheic keratosis.  It certainly is irritated and bothering her.  However it is reasonable to proceed with excisional biopsy at this point as it is changing in appearance slightly.  Await pathology results.  Return for a nurse visit in about 1 week for suture removal.  Hypothyroidism: Plan to check metabolic panel along with TSH and free T3 free free T4 to follow-up hypothyroidism.  Adjust dose as needed.  Trigeminal neuralgia: Doing reasonably well with Dilantin.  Will check Dilantin level as well.   PDMP not reviewed this encounter. Orders Placed This Encounter  Procedures  . CBC  . COMPLETE METABOLIC PANEL WITH GFR  . TSH  . T4, free  . T3, free  . Dilantin (Phenytoin) level, total   No orders of the defined types were placed in this encounter.    Historical information moved to improve visibility of documentation.  Past Medical History:  Diagnosis Date  . B12 deficiency 08/03/2009   Overview:  Vitamin B12 Deficiency  . Hypothyroidism 08/01/2009   Overview:  Hypothyroidism  10/1 IMO update  . Trigeminal neuralgia 09/26/2009   Overview:  Trigeminal Neuralgia  . Vitamin D deficiency 09/26/2009   Overview:  Vitamin D Deficiency  10/1 IMO update  . Vitreous detachment of right eye 08/07/2016   Past Surgical History:  Procedure Laterality Date  . TONSILLECTOMY AND ADENOIDECTOMY     Social History   Tobacco Use  . Smoking status: Current Every Day Smoker  . Smokeless tobacco: Never Used  Substance Use Topics  . Alcohol use: No   family history includes Cancer in her  paternal grandfather; Diabetes in her brother, father, mother, and sister; Heart disease in her father, maternal grandfather, maternal grandmother, and mother; Hyperlipidemia in her mother; Hypertension in her father and mother.  Medications: Current Outpatient Medications  Medication Sig Dispense Refill  . famotidine (PEPCID) 20 MG tablet Take 1 tablet (20 mg total) by mouth daily as needed. 90 tablet 0  . fluticasone (FLONASE) 50 MCG/ACT nasal spray Place 1 spray into both nostrils daily. 48 g 3  . levothyroxine (SYNTHROID, LEVOTHROID) 125 MCG tablet Take 1 tablet (125 mcg total) by mouth daily. 90 tablet 4  . lidocaine (LMX) 4 % cream Apply topically.    . phenytoin (DILANTIN) 100 MG ER capsule Take 3 capsules (300 mg total) by mouth daily. 270 capsule 4  . triamcinolone cream (KENALOG) 0.1 % Apply 1 application topically 2 (two) times daily. 453.6 g 12   No current facility-administered medications for this visit.    Allergies  Allergen Reactions  . Penicillins Other (See Comments) and Hives  . Topiramate     seizures  . Duloxetine Hcl Diarrhea and Nausea Only  . Gabapentin Diarrhea and Nausea Only  . Levetiracetam Diarrhea and Nausea Only  . Moxifloxacin Hcl In Nacl     hypothermia  . Pregabalin Diarrhea and Nausea Only  . Thimerosal      Discussed warning signs or symptoms. Please see discharge instructions. Patient expresses understanding.

## 2018-09-30 NOTE — Patient Instructions (Signed)
Thank you for coming in today. Recheck in 1 week for nurse visit for suture removal.  Get labs now.    Sutured Wound Care Sutures are stitches that can be used to close wounds. Taking care of your wound properly can help to prevent pain and infection. It can also help your wound to heal more quickly. Follow instructions from your health care provider about how to care for your sutured wound. Supplies needed:  Soap and water.  A clean bandage (dressing), if needed.  Antibiotic ointment.  A clean towel. How to care for your sutured wound   Keep the wound completely dry for the first 24 hours, or for as long as directed by your health care provider. After 24-48 hours, you may shower or bathe as directed by your health care provider. Do not soak or submerge the wound in water until the sutures have been removed.  After the first 24 hours, clean the wound once a day, or as often as directed by your health care provider, using the following steps: ? Wash the wound with soap and water. ? Rinse the wound with water to remove all soap. ? Pat the wound dry with a clean towel. Do not rub the wound.  After cleaning the wound, apply a thin layer of antibiotic ointment as directed by your health care provider. This will prevent infection and keep the dressing from sticking to the wound.  Follow instructions from your health care provider about how to change your dressing: ? Wash your hands with soap and water. If soap and water are not available, use hand sanitizer. ? Change your dressing at least once a day, or as often as told by your health care provider. If your dressing gets wet or dirty, change it. ? Leave sutures and other skin closures, such as adhesive tape or skin glue, in place. These skin closures may need to stay in place for 2 weeks or longer. If adhesive strip edges start to loosen and curl up, you may trim the loose edges. Do not remove adhesive strips completely unless your health  care provider tells you to do that.  Check your wound every day for signs of infection. Watch for: ? Redness, swelling, or pain. ? Fluid or blood. ? Warmth. ? Pus or a bad smell.  Have the sutures removed as directed by your health care provider. Follow these instructions at home: Medicines  Take or apply over-the-counter and prescription medicines only as told by your health care provider.  If you were prescribed an antibiotic medicine or ointment, take or apply it as told by your health care provider. Do not stop using the antibiotic even if your condition improves. General instructions  To help reduce scarring after your wound heals, cover your wound with clothing or apply sunscreen of at least 30 SPF whenever you are outside.  Do not scratch or pick at your wound.  Avoid stretching your wound.  Raise (elevate) the injured area above the level of your heart while you are sitting or lying down, if possible.  Drink enough fluids to keep your urine clear or pale yellow.  Keep all follow-up visits as told by your health care provider. This is important. Contact a health care provider if:  You received a tetanus shot and you have swelling, severe pain, redness, or bleeding at the injection site.  Your wound breaks open.  You have redness, swelling, or pain around your wound.  You have fluid or blood coming from  your wound.  Your wound feels warm to the touch.  You have a fever.  You notice something coming out of your wound, such as wood or glass.  You have pain that does not get better with medicine.  The skin near your wound changes color.  You need to change your dressing very frequently due to a lot of fluid, blood, or pus draining from the wound.  You develop a new rash.  You develop numbness around the wound. Get help right away if:  You develop severe swelling around your wound.  You have pus or a bad smell coming from your wound.  Your pain suddenly  gets worse and is severe.  You develop painful lumps near your wound or anywhere on your body.  You have a red streak going away from your wound.  The wound is on your hand or foot and: ? You cannot properly move a finger or toe. ? Your fingers or toes look pale or bluish. ? You have numbness that is spreading down your hand, foot, fingers, or toes. Summary  Sutures are stitches that can be used to close wounds.  Taking care of your wound properly can help to prevent pain and infection.  Keep the wound completely dry for the first 24 hours, or for as long as directed by your health care provider. After 24-48 hours, you may shower or bathe as directed by your health care provider. This information is not intended to replace advice given to you by your health care provider. Make sure you discuss any questions you have with your health care provider. Document Released: 04/12/2004 Document Revised: 02/15/2017 Document Reviewed: 04/10/2016 Elsevier Patient Education  2020 [employer]

## 2018-10-01 LAB — T3, FREE: T3, Free: 2.7 pg/mL (ref 2.3–4.2)

## 2018-10-01 LAB — CBC
HCT: 39.7 % (ref 35.0–45.0)
Hemoglobin: 13.6 g/dL (ref 11.7–15.5)
MCH: 30.8 pg (ref 27.0–33.0)
MCHC: 34.3 g/dL (ref 32.0–36.0)
MCV: 90 fL (ref 80.0–100.0)
MPV: 9.6 fL (ref 7.5–12.5)
Platelets: 357 10*3/uL (ref 140–400)
RBC: 4.41 10*6/uL (ref 3.80–5.10)
RDW: 12.8 % (ref 11.0–15.0)
WBC: 6.9 10*3/uL (ref 3.8–10.8)

## 2018-10-01 LAB — COMPLETE METABOLIC PANEL WITH GFR
AG Ratio: 2.3 (calc) (ref 1.0–2.5)
ALT: 20 U/L (ref 6–29)
AST: 20 U/L (ref 10–35)
Albumin: 4.8 g/dL (ref 3.6–5.1)
Alkaline phosphatase (APISO): 93 U/L (ref 37–153)
BUN/Creatinine Ratio: 8 (calc) (ref 6–22)
BUN: 6 mg/dL — ABNORMAL LOW (ref 7–25)
CO2: 28 mmol/L (ref 20–32)
Calcium: 9.6 mg/dL (ref 8.6–10.4)
Chloride: 105 mmol/L (ref 98–110)
Creat: 0.76 mg/dL (ref 0.50–0.99)
GFR, Est African American: 97 mL/min/{1.73_m2} (ref >=60)
GFR, Est Non African American: 83 mL/min/{1.73_m2} (ref >=60)
Globulin: 2.1 g/dL (calc) (ref 1.9–3.7)
Glucose, Bld: 91 mg/dL (ref 65–99)
Potassium: 4.3 mmol/L (ref 3.5–5.3)
Sodium: 139 mmol/L (ref 135–146)
Total Bilirubin: 0.4 mg/dL (ref 0.2–1.2)
Total Protein: 6.9 g/dL (ref 6.1–8.1)

## 2018-10-01 LAB — TSH: TSH: 4.1 mIU/L (ref 0.40–4.50)

## 2018-10-01 LAB — PHENYTOIN LEVEL, TOTAL: Phenytoin, Total: 6.3 mg/L — ABNORMAL LOW (ref 10.0–20.0)

## 2018-10-01 LAB — T4, FREE: Free T4: 1.2 ng/dL (ref 0.8–1.8)

## 2018-10-06 ENCOUNTER — Encounter: Payer: Self-pay | Admitting: Family Medicine

## 2018-10-06 NOTE — Telephone Encounter (Signed)
Called pt. She was on nurse visit schedule for suture removal but since having some issues and concerns, she has been moved to providers schedule

## 2018-10-07 ENCOUNTER — Encounter: Payer: Self-pay | Admitting: Sports Medicine

## 2018-10-07 ENCOUNTER — Other Ambulatory Visit: Payer: Self-pay

## 2018-10-07 ENCOUNTER — Ambulatory Visit: Payer: Medicare Other

## 2018-10-07 ENCOUNTER — Ambulatory Visit (INDEPENDENT_AMBULATORY_CARE_PROVIDER_SITE_OTHER): Payer: Medicare Other | Admitting: Sports Medicine

## 2018-10-07 DIAGNOSIS — L821 Other seborrheic keratosis: Secondary | ICD-10-CM

## 2018-10-07 DIAGNOSIS — M19012 Primary osteoarthritis, left shoulder: Secondary | ICD-10-CM | POA: Diagnosis not present

## 2018-10-07 MED ORDER — DICLOFENAC SODIUM 2 % TD SOLN: 2.0000 | Freq: Two times a day (BID) | TRANSDERMAL | 0 refills | Status: AC

## 2018-10-07 NOTE — Assessment & Plan Note (Signed)
Pathology showed a benign SK. Incision looks great. Sutures removed today, I did apply benzoin and a few Steri-Strips just for reinforcement of the wound considering its location. Return as needed for this.

## 2018-10-07 NOTE — Progress Notes (Signed)
Subjective:    CC: Follow-up  HPI: This is a pleasant 63 year old female, she had a excision of seborrheic keratosis on her left flank a week ago, doing well, ready for suture removal.  In addition she complains of pain on her left shoulder, over the top of the shoulder where her bra strap runs, directly over the acromioclavicular joint, moderate, persistent, localized without radiation.  I reviewed the past medical history, family history, social history, surgical history, and allergies today and no changes were needed.  Please see the problem list section below in epic for further details.  Past Medical History: Past Medical History:  Diagnosis Date  . B12 deficiency 08/03/2009   Overview:  Vitamin B12 Deficiency  . Hypothyroidism 08/01/2009   Overview:  Hypothyroidism  10/1 IMO update  . Trigeminal neuralgia 09/26/2009   Overview:  Trigeminal Neuralgia  . Vitamin D deficiency 09/26/2009   Overview:  Vitamin D Deficiency  10/1 IMO update  . 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 TONSILLECTOMY AND ADENOIDECTOMY     Social History: Social History   Socioeconomic History  . Marital status: Married    Spouse name: Not on file  . Number of children: Not on file  . Years of education: Not on file  . Highest education level: Not on file  Occupational History  . Not on file  Social Needs  . Financial resource strain: Not on file  . Food insecurity    Worry: Not on file    Inability: Not on file  . Transportation needs    Medical: Not on file    Non-medical: Not on file  Tobacco Use  . Smoking status: Current Every Day Smoker  . Smokeless tobacco: Never Used  Substance and Sexual Activity  . Alcohol use: No  . Drug use: No  . Sexual activity: Yes  Lifestyle  . Physical activity    Days per week: Not on file    Minutes per session: Not on file  . Stress: Not on file  Relationships  .  Social Herbalist on phone: Not on file    Gets together: Not on file    Attends religious service: Not on file    Active member of club or organization: Not on file    Attends meetings of clubs or organizations: Not on file    Relationship status: Not on file  Other Topics Concern  . Not on file  Social History Narrative  . Not on file   Family History: Family History  Problem Relation Age of Onset  . Heart disease Mother   . Diabetes Mother   . Hyperlipidemia Mother   . Hypertension Mother   . Heart disease Father   . Diabetes Father   . Hypertension Father   . Diabetes Sister   . Diabetes Brother   . Heart disease Maternal Grandmother   . Heart disease Maternal Grandfather   . Cancer Paternal Grandfather    Allergies: Allergies  Allergen Reactions  . Penicillins Other (See Comments) and Hives  . Topiramate     seizures  . Duloxetine Hcl Diarrhea and Nausea Only  . Gabapentin Diarrhea and Nausea Only  . Levetiracetam Diarrhea and Nausea Only  . Moxifloxacin Hcl In Nacl     hypothermia  . Pregabalin Diarrhea and Nausea Only  . Thimerosal    Medications: See med rec.  Review of Systems: No fevers,  chills, night sweats, weight loss, chest pain, or shortness of breath.   Objective:    General: Well Developed, well nourished, and in no acute distress.  Neuro: Alert and oriented x3, extra-ocular muscles intact, sensation grossly intact.  HEENT: Normocephalic, atraumatic, pupils equal round reactive to light, neck supple, no masses, no lymphadenopathy, thyroid nonpalpable.  Skin: Warm and dry, no rashes.  Incision is clean, dry, intact, 2 simple interrupted and a single horizontal mattress suture was removed Cardiac: Regular rate and rhythm, no murmurs rubs or gallops, no lower extremity edema.  Respiratory: Clear to auscultation bilaterally. Not using accessory muscles, speaking in full sentences. Left shoulder: Inspection reveals no abnormalities, atrophy  or asymmetry. Tender over the acromioclavicular joint with a positive crossarm sign. ROM is full in all planes. Rotator cuff strength normal throughout. No signs of impingement with negative Neer and Hawkin's tests, empty can. Speeds and Yergason's tests normal. No labral pathology noted with negative Obrien's, negative crank, negative clunk, and good stability. Normal scapular function observed. No painful arc and no drop arm sign. No apprehension sign  Impression and Recommendations:    Arthritis of left acromioclavicular joint Oral NSAIDs are not helping, declines injection. Samples given of topical Pennsaid. May follow-up with Dr. Denyse Amassorey for this, I advised her that she has no evidence of frozen shoulder today, excellent range of motion.  Seborrheic keratosis Pathology showed a benign SK. Incision looks great. Sutures removed today, I did apply benzoin and a few Steri-Strips just for reinforcement of the wound considering its location. Return as needed for this.   ___________________________________________ Ihor Austinhomas J. Benjamin Stainhekkekandam, M.D., ABFM., CAQSM. Primary Care and Sports Medicine [facility]  Adjunct Professor of Family Medicine  [employer]

## 2018-10-07 NOTE — Assessment & Plan Note (Signed)
Oral NSAIDs are not helping, declines injection. Samples given of topical Pennsaid. May follow-up with Dr. Georgina Snell for this, I advised her that she has no evidence of frozen shoulder today, excellent range of motion.

## 2018-12-15 DIAGNOSIS — H43393 Other vitreous opacities, bilateral: Secondary | ICD-10-CM | POA: Diagnosis not present

## 2018-12-15 DIAGNOSIS — H43813 Vitreous degeneration, bilateral: Secondary | ICD-10-CM | POA: Diagnosis not present

## 2018-12-15 DIAGNOSIS — H35031 Hypertensive retinopathy, right eye: Secondary | ICD-10-CM | POA: Diagnosis not present

## 2019-01-02 DIAGNOSIS — H35033 Hypertensive retinopathy, bilateral: Secondary | ICD-10-CM | POA: Diagnosis not present

## 2019-01-02 DIAGNOSIS — H43393 Other vitreous opacities, bilateral: Secondary | ICD-10-CM | POA: Diagnosis not present

## 2019-01-02 DIAGNOSIS — H43813 Vitreous degeneration, bilateral: Secondary | ICD-10-CM | POA: Diagnosis not present

## 2019-06-16 DIAGNOSIS — E538 Deficiency of other specified B group vitamins: Secondary | ICD-10-CM | POA: Diagnosis not present

## 2019-06-16 DIAGNOSIS — E559 Vitamin D deficiency, unspecified: Secondary | ICD-10-CM | POA: Diagnosis not present

## 2019-06-16 DIAGNOSIS — J309 Allergic rhinitis, unspecified: Secondary | ICD-10-CM | POA: Diagnosis not present

## 2019-06-16 DIAGNOSIS — E038 Other specified hypothyroidism: Secondary | ICD-10-CM | POA: Diagnosis not present

## 2019-06-16 DIAGNOSIS — E063 Autoimmune thyroiditis: Secondary | ICD-10-CM | POA: Diagnosis not present

## 2019-06-16 DIAGNOSIS — M2550 Pain in unspecified joint: Secondary | ICD-10-CM | POA: Diagnosis not present

## 2019-06-16 DIAGNOSIS — G500 Trigeminal neuralgia: Secondary | ICD-10-CM | POA: Diagnosis not present

## 2019-06-16 DIAGNOSIS — Z0000 Encounter for general adult medical examination without abnormal findings: Secondary | ICD-10-CM | POA: Diagnosis not present

## 2019-06-16 DIAGNOSIS — R519 Headache, unspecified: Secondary | ICD-10-CM | POA: Diagnosis not present

## 2019-06-16 DIAGNOSIS — R5381 Other malaise: Secondary | ICD-10-CM | POA: Diagnosis not present

## 2019-06-16 DIAGNOSIS — R5383 Other fatigue: Secondary | ICD-10-CM | POA: Diagnosis not present

## 2019-06-16 DIAGNOSIS — I471 Supraventricular tachycardia: Secondary | ICD-10-CM | POA: Diagnosis not present
